# Patient Record
Sex: Male | Born: 1960 | Race: White | Hispanic: No | Marital: Married | Smoking: Never smoker
Health system: Southern US, Community
[De-identification: ages and names within clinical notes are randomized; demographics above are authoritative.]

## PROBLEM LIST (undated history)

## (undated) DIAGNOSIS — I10 Essential (primary) hypertension: Secondary | ICD-10-CM

## (undated) DIAGNOSIS — E119 Type 2 diabetes mellitus without complications: Secondary | ICD-10-CM

## (undated) DIAGNOSIS — F1010 Alcohol abuse, uncomplicated: Secondary | ICD-10-CM

## (undated) DIAGNOSIS — M1990 Unspecified osteoarthritis, unspecified site: Secondary | ICD-10-CM

## (undated) DIAGNOSIS — G4730 Sleep apnea, unspecified: Secondary | ICD-10-CM

## (undated) DIAGNOSIS — L409 Psoriasis, unspecified: Secondary | ICD-10-CM

## (undated) HISTORY — DX: Type 2 diabetes mellitus without complications: E11.9

## (undated) HISTORY — DX: Unspecified osteoarthritis, unspecified site: M19.90

## (undated) HISTORY — DX: Essential (primary) hypertension: I10

## (undated) HISTORY — PX: CHOLECYSTECTOMY: SHX55

---

## 2004-06-05 ENCOUNTER — Ambulatory Visit: Payer: Self-pay | Admitting: Internal Medicine

## 2004-06-09 ENCOUNTER — Ambulatory Visit: Payer: Self-pay | Admitting: Internal Medicine

## 2004-06-09 ENCOUNTER — Ambulatory Visit (HOSPITAL_COMMUNITY): Admission: RE | Admit: 2004-06-09 | Discharge: 2004-06-09 | Payer: Self-pay | Admitting: Internal Medicine

## 2004-06-10 ENCOUNTER — Ambulatory Visit (HOSPITAL_COMMUNITY): Admission: RE | Admit: 2004-06-10 | Discharge: 2004-06-10 | Payer: Self-pay | Admitting: Internal Medicine

## 2004-07-14 ENCOUNTER — Ambulatory Visit: Payer: Self-pay | Admitting: Internal Medicine

## 2008-06-13 ENCOUNTER — Encounter: Admission: RE | Admit: 2008-06-13 | Discharge: 2008-06-13 | Payer: Self-pay | Admitting: Occupational Medicine

## 2009-06-07 ENCOUNTER — Encounter (INDEPENDENT_AMBULATORY_CARE_PROVIDER_SITE_OTHER): Payer: Self-pay | Admitting: *Deleted

## 2009-06-28 DIAGNOSIS — K648 Other hemorrhoids: Secondary | ICD-10-CM | POA: Insufficient documentation

## 2009-06-28 DIAGNOSIS — G4730 Sleep apnea, unspecified: Secondary | ICD-10-CM | POA: Insufficient documentation

## 2009-07-01 ENCOUNTER — Ambulatory Visit: Payer: Self-pay | Admitting: Internal Medicine

## 2009-07-01 DIAGNOSIS — L408 Other psoriasis: Secondary | ICD-10-CM

## 2009-07-01 DIAGNOSIS — E119 Type 2 diabetes mellitus without complications: Secondary | ICD-10-CM

## 2009-07-04 ENCOUNTER — Encounter: Payer: Self-pay | Admitting: Internal Medicine

## 2009-07-15 ENCOUNTER — Ambulatory Visit (HOSPITAL_COMMUNITY): Admission: RE | Admit: 2009-07-15 | Discharge: 2009-07-15 | Payer: Self-pay | Admitting: Internal Medicine

## 2009-07-15 ENCOUNTER — Ambulatory Visit: Payer: Self-pay | Admitting: Internal Medicine

## 2010-03-01 HISTORY — PX: MENISCUS REPAIR: SHX5179

## 2010-07-01 NOTE — Letter (Signed)
Summary: Appointment Reminder  [facility]  [address]   [place], [place] [address]   Phone: [phone]  Fax: [phone]       June 07, 2009   Albert Tanner [address] [place], [place]  [address] [birth year]    Dear [name],  We have been unable to reach you by phone to schedule a follow up   appointment that was recommended for you by Dr. Jena Gauss. It is very   important that we reach you to schedule an appointment. We hope that you  allow [place] to participate in your health care needs. Please contact us at  [phone] at your earliest convenience to schedule your appointment.  Sincerely,    [facility] R. Roetta Sessions, M.D.    Kassie Mends, M.D. Lorenza Burton, FNP-BC    Tana Coast, PA-C Phone: [phone]    Fax: [phone]

## 2010-07-01 NOTE — Assessment & Plan Note (Signed)
Summary: E30/RECTAL BLEEDING.GU   Visit Type:  Initial Consult Referring Provider:  Sasser Primary Care Provider:  Sasser  Chief Complaint:  FH colon CA.  History of Present Illness: 50 y/o caucasian male w/ FH positive for colon CA in brother dx last yr age 12.  He denies any rectal bleeding or melena except when he has "flare of psoriasis."  Last flare over 1 month ago.  Denies any abd pain.  The patient denies any upper GI symptoms which incluse nausea, vomiting, heartburn, indigestion, dysphagia, odynophagia, anorexia or early satiety.   Current Problems (verified): 1)  Dm  (ICD-250.00) 2)  Neoplasm, Malignant, Colon, Family Hx, Sibling  (ICD-V16.0) 3)  Psoriasis  (ICD-696.1) 4)  Sleep Apnea  (ICD-780.57) 5)  Hemorrhoids, Internal  (ICD-455.0)  Current Medications (verified): 1)  Naproxen Sodium 550 Mg Tabs (Naproxen Sodium) .... As Needed 2)  Metformin .... One Tablet Daily in The Am 3)  Ointment .... Daily 4)  Aspir-Low 81 Mg Tbec (Aspirin) .... Take 1 Tablet By Mouth Once A Day  Allergies (verified): No Known Drug Allergies  Past History:  Past Medical History: DM (ICD-250.00) NEOPLASM, MALIGNANT, COLON, FAMILY HX, SIBLING (ICD-V16.0) PSORIASIS (ICD-696.1) SLEEP APNEA (ICD-780.57) HEMORRHOIDS, INTERNAL (ICD-455) last colonoscopy 06/2004 Mild diverticulitis 2/06  Family History: Brother dx colon ca age 50  Social History: married Employed city of [place] Patient has never smoked.  Illicit Drug Use - no beer or whiskey couple per monthSmoking Status:  never Drug Use:  no  Review of Systems General:  Denies fever, chills, sweats, anorexia, fatigue, weakness, malaise, weight loss, and sleep disorder. CV:  Denies chest pains, angina, palpitations, syncope, dyspnea on exertion, orthopnea, PND, peripheral edema, and claudication. Resp:  Denies dyspnea at rest, dyspnea with exercise, cough, sputum, wheezing, coughing up blood, and pleurisy. GI:  Denies difficulty  swallowing, pain on swallowing, nausea, indigestion/heartburn, vomiting, vomiting blood, abdominal pain, jaundice, gas/bloating, diarrhea, constipation, change in bowel habits, bloody BM's, black BMs, and fecal incontinence. GU:  Denies urinary burning, blood in urine, urinary frequency, urinary hesitancy, and nocturnal urination. Derm:  Complains of rash and dry skin; psoriasis. Psych:  Denies depression, anxiety, memory loss, suicidal ideation, hallucinations, paranoia, phobia, and confusion. Heme:  Denies bruising, bleeding, and enlarged lymph nodes.  Vital Signs:  Patient profile:   50 year old male Height:      72 inches Weight:      346 pounds BMI:     47.10 Temp:     98.6 degrees F oral Pulse rate:   72 / minute BP sitting:   142 / 90  (left arm) Cuff size:   large  Vitals Entered By: Cloria Spring LPN (July 01, 2009 2:25 PM)  Physical Exam  General:  obese.  Well developed, well nourished, no acute distress. Head:  Normocephalic and atraumatic. Eyes:  Sclera clear, no icterus. Ears:  Normal auditory acuity. Nose:  No deformity, discharge,  or lesions. Mouth:  No deformity or lesions, dentition normal. Neck:  Supple; no masses or thyromegaly. Lungs:  Clear throughout to auscultation. Heart:  Regular rate and rhythm; no murmurs, rubs,  or bruits. Abdomen:  Soft, nontender and nondistended. No masses, hepatosplenomegaly or hernias noted. Normal bowel sounds.without guarding and without rebound.   Rectal:  deferred until time of colonoscopy.   Msk:  Symmetrical with no gross deformities. Normal posture. Pulses:  Normal pulses noted. Extremities:  No clubbing, cyanosis, edema or deformities noted. Neurologic:  Alert and  oriented x4;  grossly normal neurologically.  Skin:  Intact without significant lesions or rashes. Cervical Nodes:  No significant cervical adenopathy. Psych:  Alert and cooperative. Normal mood and affect.  Impression & Recommendations:  Problem # 1:   NEOPLASM, MALIGNANT, COLON, FAMILY HX, SIBLING (ICD-V16.0) 68 y/o caucasian male due for high-risk screening colonoscopy.  Denies any current GI complaints.  Screeening colonoscopy to be performed by Dr. Jena Gauss in the near future.  I have discussed risks and benefits which include, but are not limited to, bleeding, infection, perforation, or medication reaction.  The patient agrees with this plan and consent will be obtained.  Orders: Consultation Level III [phone])

## 2010-07-01 NOTE — Letter (Signed)
Summary: TCS ORDER  TCS ORDER   Imported By: Ricard Dillon 07/04/2009 12:58:57  _____________________________________________________________________  External Attachment:    Type:   Image     Comment:   External Document

## 2010-08-21 LAB — GLUCOSE, CAPILLARY

## 2010-10-17 NOTE — Op Note (Signed)
NAME:  Albert Tanner, Albert Tanner             ACCOUNT NO.:  [id number]   MEDICAL RECORD NO.:  [id number]          PATIENT TYPE:  AMB   LOCATION:  DAY                           FACILITY:  [facility]   PHYSICIAN:  R. Roetta Sessions, M.D. DATE OF BIRTH:  27-Dec-1960   DATE OF PROCEDURE:  06/09/2004  DATE OF DISCHARGE:                                 OPERATIVE REPORT   PROCEDURE PERFORMED:  Colonoscopy with ileoscopy, diagnostic.   INDICATIONS FOR PROCEDURE:  The patient is a 50 year old gentleman with a  two month history of bilateral lower quadrant abdominal pain which sometimes  radiates into his back and down his right groin and testicular area.  He has  been treated for epididymitis by Dr. Neita Carp.  He has also been noted to be  Hemoccult positive at Dr. Dian Situ office although Mr. Rothgeb denies seeing  any blood per rectum.  He also denies melena.  He denies upper GI tract  symptoms such as odynophagia, dysphagia, early satiety, reflux symptoms,  nausea or vomiting.  He has not lost any weight.  Colonoscopy is now being  down to further evaluate Hemoccult positive stools.  This approach has been  discussed with the patient at length.  Potential risks, benefits and  alternatives have been reviewed, questions answered.  He is agreeable.   Oxygen saturations, blood pressure, pulses and respirations were monitored  throughout the entire procedure.   CONSCIOUS SEDATION:  Versed 4 mg IV, Demerol 100 mg IV in divided doses.   INSTRUMENT USED:  Olymppus video chip system.   FINDINGS:  Digital rectal exam revealed no abnormalities.   ENDOSCOPIC FINDINGS:  Prep was good.   Rectum:  Examination of rectal mucosa including the retroflex view of the  anal verge revealed only minimal internal hemorrhoids.   Colon:  The colonic mucosa surveyed from the rectosigmoid junction to the  left transverse and right colon to the area of the appendiceal orifice and  ileocecal valve and cecum.  These structures were  well seen and photographed  for the record.  The terminal ileum was intubated a good 20 cm as well.  The  terminal ileum mucosa appeared normal  There were prominent  lymphoid  aggregates.  From the level of the cecum and ileocecal valve the scope was  slowly withdrawn.  All previously mentioned mucosal surfaces were again  seen.  The colonic mucosa appeared entirely normal.  The patient tolerated  the procedure well and was reacted in endoscopy.   IMPRESSION:  1.  Minimal internal hemorrhoids.  Otherwise normal rectum.  Normal colon.  2.  Normal terminal ileum.   RECOMMENDATIONS:  We will proceed with an abdominopelvic CT with IV and oral  contrast to further evaluate this gentleman's lower abdominal pain.  Further  recommendations to follow.     Otelia Sergeant   RMR/MEDQ  D:  06/09/2004  T:  06/09/2004  Job:  [id number]   cc:   Fara Chute  [address] [place]  [place] [address]  Fax: [phone]

## 2010-10-17 NOTE — Consult Note (Signed)
NAME:  Albert Tanner, Albert Tanner             ACCOUNT NO.:  [id number]   MEDICAL RECORD NO.:  [id number]           PATIENT TYPE:   LOCATION:                                 FACILITY:   PHYSICIAN:  R. Roetta Sessions, M.D. DATE OF BIRTH:  August 06, 1960   DATE OF CONSULTATION:  DATE OF DISCHARGE:                                   CONSULTATION   CHIEF COMPLAINT:  Bilateral lower quadrant abdominal pain and intermittent  hematochezia.   HISTORY OF PRESENT ILLNESS:  Albert Tanner is a 50 year old Caucasian male  patient of Dr. Dian Situ.  He was recently treated with antibiotics for  epididymitis.  Upon discontinuing the antibiotics, he began to have  persistent bilateral lower quadrant abdominal pain.  He notes that the  patient is just below his umbilicus.  He is also complaining of low back  pain and pain that radiates down to his right groin and testicular area.  He  notes that the patient is typically a sharp stabbing pain.  He also notes  about two weeks ago, a low-grade fever with the pain.  He has been treated  with both Cipro and doxycycline.  He denies any diarrhea or constipation.  He typically has a bowel movement once or twice a day.  He has also noticed  intermittent hematochezia with small amounts of bright red rectal bleeding  noted on the toilet paper.  He denies any blood actually in the stool.  He  denies any known hemorrhoidal disease.  He denies any melena.  He rates the  pain around 4-5/10 on pain scale and notes that the pain is intermittent and  migratory.   PAST MEDICAL HISTORY:  1.  Sleep apnea.  2.  Epididymitis.  3.  Bone spur in his right heel for which he takes p.r.n. naproxen      infrequently.   PAST SURGICAL HISTORY:  Denies.   CURRENT MEDICATIONS:  1.  Doxycycline 100 mg b.i.d.  2.  Naproxen 550 mg p.r.n.   ALLERGIES:  NO KNOWN DRUG ALLERGIES.   FAMILY HISTORY:  No known family history of colorectal carcinoma, liver, or  chronic GI problems.  His mother at age  67 is relatively healthy and alive.  Father deceased at ago 29 secondary to diabetes mellitus and lung carcinoma.  He was a smoker.  Albert Tanner has three sisters who are relatively healthy  and three brothers with history of significant coronary artery disease and  diabetes mellitus.   SOCIAL HISTORY:  Albert Tanner has been married for 18 years.  He has two  stepchildren.  He is employed full-time with [employer].  He denies any drug or tobacco use.  He reports alcohol consumption of a beer  or two a couple of times a month.   REVIEW OF SYSTEMS:  CONSTITUTIONAL:  His weight has remained stable.  Appetite is good.  He denies any fatigue.  CARDIOVASCULAR:  Denies any chest  pain or palpations.  PULMONARY:  Denies any shortness of breath, dyspnea,  cough, or hemoptysis.  GI:  See HPI.  He denies any heartburn, indigestion,  dysphagia  or odynophagia, nausea, or vomiting.  HEMATOLOGICAL:  He denies  any history of blood dyscrasias, easy bruising, or epistaxis.   PHYSICAL EXAMINATION:  VITAL SIGNS:  Weight 330 pounds, height 71 inches.  Temperature 97.8, blood pressure 140/80, pulse 88.  GENERAL:  Albert Tanner is an obese Caucasian male who is alert, oriented,  pleasant, and cooperative, and in no acute distress.  He is accompanied by  his wife today.  HEENT:  __________ .  Sclerae nonicteric.  Conjunctivae pink.  Oropharynx  pink and moist without any lesions.  NECK:  Supple, without masses or thyromegaly.  CHEST:  Heart regular rate and rhythm with normal S1 and S2.  No clicks,  rubs, or gallops.  LUNGS:  Clear to auscultation bilaterally.  ABDOMEN:  Protuberant, obese.  He does have positive bowel sounds x4, no  bruits auscultated.  There is palpable fullness just right at the umbilicus  without discrete mass, although exam is extremely limited due to the  patient's body habitus.  Unable to palpate hepatosplenomegaly.  There is no  rebound tenderness or guarding.   RECTAL:  He does have around a 2-cm perirectal erythema with a small fissure  that is not actively bleeding.  He has good sphincter tone.  There are no  internal masses palpated.  A small amount of light brown stool is obtained  from the vault which is Hemoccult-negative.  EXTREMITIES:  2+ pedal pulses bilaterally.  No edema.  SKIN:  Pink, warm, and dry without any rash or jaundice.   IMPRESSION:  Albert Tanner is a 51 year old Caucasian male with at least a two-  month history of bilateral quadrant low abdominal pain which does radiate  through to his back and down to his right groin and testicular area.  He has  been treated with antibiotics for possible epididymitis through Dr. Dian Situ  office.  He notes resolution of his symptoms until he discontinues  antibiotics and then has flares.  He was also noted to have intermittent hematochezia and gross blood per  rectum on exam at Dr. Dian Situ office.  Given these findings, I feel that  colonoscopy should be planned to rule out colorectal carcinoma.  His  symptoms do not directly correlate with diverticulitis either.   RECOMMENDATIONS:  1.  Will schedule colonoscopy with Dr. Jena Gauss in the near future.  I have      discussed the procedure, including the risks and benefits which include      but are not limited to bleeding, infection, perforation, drug reaction,      increased pain.  Consent will be obtained.  2.  Offered cream for perirectal irritation; however, the patient has      declined at this time.  3.  Further recommendations pending procedure.   We would like to thank Dr. Neita Carp for allowing [place] to participate in the care  of Albert Tanner.     Kand   KC/MEDQ  D:  06/05/2004  T:  06/05/2004  Job:  [id number]

## 2012-07-14 ENCOUNTER — Ambulatory Visit (INDEPENDENT_AMBULATORY_CARE_PROVIDER_SITE_OTHER): Payer: 59 | Admitting: General Surgery

## 2012-07-29 ENCOUNTER — Encounter (INDEPENDENT_AMBULATORY_CARE_PROVIDER_SITE_OTHER): Payer: Self-pay | Admitting: General Surgery

## 2012-07-29 ENCOUNTER — Ambulatory Visit (INDEPENDENT_AMBULATORY_CARE_PROVIDER_SITE_OTHER): Payer: 59 | Admitting: General Surgery

## 2012-07-29 DIAGNOSIS — Z6841 Body Mass Index (BMI) 40.0 and over, adult: Secondary | ICD-10-CM

## 2012-07-29 DIAGNOSIS — I10 Essential (primary) hypertension: Secondary | ICD-10-CM

## 2012-07-29 DIAGNOSIS — E119 Type 2 diabetes mellitus without complications: Secondary | ICD-10-CM

## 2012-07-29 NOTE — Progress Notes (Signed)
Subjective:   morbid obesity  Patient ID: Albert Tanner, male   DOB: [birth year], 52 y.o.   MRN: [id number]  HPI Albert Tanner y.o.male presents for consideration for surgical treatment for morbid obesity.  he gives a history of progressive obesity sincechildhood  despite multiple attempts at medical management.  his weight has been affecting him in a number of ways includingchronic joint pain that limits his activity as well as the onset of diabetes and mild hypertension and sleep apnea. He is very concerned about his long-term health going forward at his current weight.. he has been to our initial information seminar, researched surgical options thoroughly and to this point is undecided about which operation might be best for him.  Past Medical History  Diagnosis Date  . Arthritis   . Diabetes mellitus without complication   . Hypertension    Past Surgical History  Procedure Laterality Date  . Meniscus repair  03/2010   Current Outpatient Prescriptions  Medication Sig Dispense Refill  . aspirin 81 MG tablet Take 81 mg by mouth daily.      Marland Kitchen lisinopril (PRINIVIL,ZESTRIL) 10 MG tablet Take 10 mg by mouth daily.      . metFORMIN (GLUCOPHAGE) 500 MG tablet Take 500 mg by mouth daily.      . naproxen (NAPROSYN) 500 MG tablet Take 500 mg by mouth daily.      Mordecai Maes ointment        No current facility-administered medications for this visit.   No Known Allergies History  Substance Use Topics  . Smoking status: Never Smoker   . Smokeless tobacco: Never Used  . Alcohol Use: Yes     Comment: Social.     Review of Systems General: Denies fever chills malaise HEENT: Eyes vision hearing swallowing problems Respiratory: Some shortness of breath with exertion, flight of stairs Cardiac: Denies chest pain palpitations he does have some leg edema Abdomen: No reflux abdominal pain change in bowel habits. Colonoscopy is up-to-date. Extremities: Mild lower extremity edema and venous  changes     Objective:   Physical Exam BP 142/88  Pulse 88  Temp(Src) 97.8 F (36.6 C) (Temporal)  Resp 20  Ht 5\' 11"  (1.803 m)  Wt 342 lb (155.13 kg)  BMI 47.72 kg/m2 General: Alert, morbidly obese Caucasian male, in no distress Skin: Warm and dry without rash. There are a few superficial skin wound scattered over the abdomen. HEENT: No palpable masses or thyromegaly. Sclera nonicteric. Pupils equal round and reactive. Oropharynx clear. Lymph nodes: No cervical, supraclavicular, or inguinal nodes palpable. Lungs: Breath sounds clear and equal without increased work of breathing Cardiovascular: Regular rate and rhythm without murmur. No JVD. There are moderate changes of chronic venous stasis bilateral lower extremities.. Peripheral pulses intact. Abdomen: Nondistended. Soft and nontender. No masses palpable. No organomegaly. No palpable hernias. Extremities: No edema or joint swelling or deformity. No chronic venous stasis changes. Neurologic: Alert and fully oriented. Gait normal.    Assessment:     Patient with progressive morbid obesity unresponsive to multiple efforts at medical management who presents with a BMI of 47 and comorbidities of diabetes mellitus, chronic joint pain,hypercholesterolemia and hypertension. I believe there would be very significant medical benefit from surgical weight loss. After our discussion of surgical options currently available the patient has decided to proceed with laparoscopic Roux-en-Y gastric bypass due to it's specific effect on diabetes and reliability of the weight loss. We have discussed the nature of the problem and the  risks of remaining morbidly obese. We discussed laparoscopic Roux-en-Y gastric bypass in detail including the nature of the procedure, expected hospitalization and recovery, and major risks of anesthetic complications, bleeding, blood clots and pulmonary emboli, leakage and infection and long-term risks of stricture, ulceration,  bowel obstruction, nutritional deficiencies, and failure to lose weight or weight regain. We discussed these problems could lead to death. The patient was given a complete consent form to review and all questions were answered. We will go ahead with preoperative including psychological and nutrition evaluations, H. pylori testing, ultrasound, bone density, and routine lab and x-rays. I will see the patient back following these studies.    Plan:     As above

## 2012-08-06 ENCOUNTER — Encounter: Payer: Self-pay | Admitting: *Deleted

## 2012-08-06 ENCOUNTER — Encounter: Payer: 59 | Attending: General Surgery | Admitting: *Deleted

## 2012-08-06 DIAGNOSIS — Z713 Dietary counseling and surveillance: Secondary | ICD-10-CM | POA: Insufficient documentation

## 2012-08-06 NOTE — Progress Notes (Addendum)
  Pre-Op Assessment Visit:  Pre-Operative RYGB Surgery  Medical Nutrition Therapy:  Appt start time: 1600   End time:  1700.  Patient was seen on 08/06/2012 for Pre-Operative RYGB Nutrition Assessment. Assessment and letter of approval faxed to [facility] Bariatric Surgery Program coordinator on 08/08/12.  Approval letter sent to [facility] center and will be available in the chart under the media tab.  Handouts given during visit include:  Pre-Op Goals   Bariatric Surgery Protein Shakes  Samples given during visit include:   Premier Protein shake Lot: [id number]; Exp: 06/13/13 Lot: [id number]; Exp: 04/08/13  Patient to call for Pre-Op and Post-Op Nutrition Education at the [facility] when surgery is scheduled.

## 2012-08-06 NOTE — Patient Instructions (Addendum)
   Follow Pre-Op Nutrition Goals to prepare for Gastric Bypass Surgery.   Call the [facility] at [phone] once you have been given your surgery date to enrolled in the Pre-Op Nutrition Class. You will need to attend this nutrition class 3-4 weeks prior to your surgery. 

## 2012-08-10 LAB — CBC WITH DIFFERENTIAL/PLATELET
Basophils Absolute: 0 10*3/uL (ref 0.0–0.1)
Basophils Relative: 0 % (ref 0–1)
Eosinophils Absolute: 0.2 10*3/uL (ref 0.0–0.7)
Eosinophils Relative: 2 % (ref 0–5)
HCT: 43 % (ref 39.0–52.0)
MCH: 29.8 pg (ref 26.0–34.0)
MCHC: 34.2 g/dL (ref 30.0–36.0)
MCV: 87 fL (ref 78.0–100.0)
Monocytes Absolute: 0.8 10*3/uL (ref 0.1–1.0)
RDW: 14 % (ref 11.5–15.5)

## 2012-08-10 LAB — LIPID PANEL
HDL: 40 mg/dL (ref >39)
Triglycerides: 75 mg/dL (ref <150)

## 2012-08-10 LAB — COMPREHENSIVE METABOLIC PANEL
AST: 19 U/L (ref 0–37)
Alkaline Phosphatase: 64 U/L (ref 39–117)
BUN: 15 mg/dL (ref 6–23)
Creat: 1.01 mg/dL (ref 0.50–1.35)

## 2012-08-11 LAB — HEMOGLOBIN A1C
Hgb A1c MFr Bld: 6.6 % — ABNORMAL HIGH (ref <5.7)
Mean Plasma Glucose: 143 mg/dL — ABNORMAL HIGH (ref <117)

## 2012-08-19 ENCOUNTER — Ambulatory Visit (HOSPITAL_COMMUNITY)
Admission: RE | Admit: 2012-08-19 | Discharge: 2012-08-19 | Disposition: A | Discharge status: Home / Self Care | Payer: 59 | Source: Ambulatory Visit | Attending: General Surgery | Admitting: General Surgery

## 2012-08-22 ENCOUNTER — Ambulatory Visit (HOSPITAL_COMMUNITY)
Admission: RE | Admit: 2012-08-22 | Discharge: 2012-08-22 | Disposition: A | Discharge status: Home / Self Care | Payer: 59 | Source: Ambulatory Visit | Attending: General Surgery | Admitting: General Surgery

## 2012-08-22 ENCOUNTER — Other Ambulatory Visit: Payer: Self-pay

## 2012-08-22 DIAGNOSIS — G4730 Sleep apnea, unspecified: Secondary | ICD-10-CM | POA: Insufficient documentation

## 2012-08-22 DIAGNOSIS — K7689 Other specified diseases of liver: Secondary | ICD-10-CM | POA: Insufficient documentation

## 2012-08-22 DIAGNOSIS — Z6841 Body Mass Index (BMI) 40.0 and over, adult: Secondary | ICD-10-CM | POA: Insufficient documentation

## 2012-08-22 DIAGNOSIS — E119 Type 2 diabetes mellitus without complications: Secondary | ICD-10-CM | POA: Insufficient documentation

## 2012-08-22 DIAGNOSIS — E7800 Pure hypercholesterolemia, unspecified: Secondary | ICD-10-CM | POA: Insufficient documentation

## 2012-08-22 DIAGNOSIS — I10 Essential (primary) hypertension: Secondary | ICD-10-CM | POA: Insufficient documentation

## 2012-08-22 DIAGNOSIS — M129 Arthropathy, unspecified: Secondary | ICD-10-CM | POA: Insufficient documentation

## 2012-09-09 ENCOUNTER — Encounter (HOSPITAL_COMMUNITY)
Admission: RE | Disposition: A | Discharge status: Home / Self Care | Payer: Self-pay | Source: Ambulatory Visit | Attending: General Surgery

## 2012-09-09 ENCOUNTER — Ambulatory Visit (HOSPITAL_COMMUNITY)
Admission: RE | Admit: 2012-09-09 | Discharge: 2012-09-09 | Disposition: A | Discharge status: Home / Self Care | Payer: 59 | Source: Ambulatory Visit | Attending: General Surgery | Admitting: General Surgery

## 2012-09-09 DIAGNOSIS — Z01818 Encounter for other preprocedural examination: Secondary | ICD-10-CM | POA: Insufficient documentation

## 2012-09-09 HISTORY — PX: BREATH TEK H PYLORI: SHX5422

## 2012-09-09 SURGERY — BREATH TEST, FOR HELICOBACTER PYLORI

## 2012-09-12 ENCOUNTER — Encounter (HOSPITAL_COMMUNITY): Payer: Self-pay | Admitting: General Surgery

## 2012-09-29 ENCOUNTER — Encounter: Payer: 59 | Attending: General Surgery | Admitting: *Deleted

## 2012-09-29 DIAGNOSIS — Z713 Dietary counseling and surveillance: Secondary | ICD-10-CM | POA: Insufficient documentation

## 2012-10-05 ENCOUNTER — Encounter (INDEPENDENT_AMBULATORY_CARE_PROVIDER_SITE_OTHER): Payer: Self-pay | Admitting: General Surgery

## 2012-10-05 ENCOUNTER — Ambulatory Visit (INDEPENDENT_AMBULATORY_CARE_PROVIDER_SITE_OTHER): Payer: 59 | Admitting: General Surgery

## 2012-10-05 ENCOUNTER — Other Ambulatory Visit (INDEPENDENT_AMBULATORY_CARE_PROVIDER_SITE_OTHER): Payer: Self-pay | Admitting: General Surgery

## 2012-10-05 NOTE — Progress Notes (Signed)
Chief complaint: Morbid obesity, preop sleeve gastrectomy  History: Patient returns for his preop visit prior to planned laparoscopic sleeve gastrectomy. At his initial visit he had elected to proceed with Roux-en-Y gastric bypass but was not completely certain about his decision. Since then he has done extensive research on surgical options in addition to what we discussed in the office. He expressed very clear understanding of the differences between the procedures and he feels at this point he would be most comfortable with the sleeve gastrectomy as it is somewhat less extensive than the Roux-en-Y gastric bypass but does not require repeated office visits and fills such as the band. We went over the procedure again in detail. He has been through the complete consent form were reviewed this and all his questions were answered.  Past Medical History  Diagnosis Date  . Arthritis   . Diabetes mellitus without complication   . Hypertension    Past Surgical History  Procedure Laterality Date  . Meniscus repair  03/2010  . Breath tek h pylori N/A 09/09/2012    Procedure: BREATH TEK H PYLORI;  Surgeon: Mariella Saa, MD;  Location: [facility];  Service: General;  Laterality: N/A;   Current Outpatient Prescriptions  Medication Sig Dispense Refill  . aspirin 81 MG tablet Take 81 mg by mouth daily.      Marland Kitchen lisinopril (PRINIVIL,ZESTRIL) 10 MG tablet Take 10 mg by mouth daily.      . metFORMIN (GLUCOPHAGE) 500 MG tablet Take 500 mg by mouth daily.      . naproxen (NAPROSYN) 500 MG tablet Take 500 mg by mouth daily.      Mordecai Maes ointment Apply 1 application topically daily.        No current facility-administered medications for this visit.   No Known Allergies History  Substance Use Topics  . Smoking status: Never Smoker   . Smokeless tobacco: Never Used  . Alcohol Use: Yes     Comment: Social.   Exam: BP 120/82  Pulse 92  Temp(Src) 98.1 F (36.7 C) (Oral)  Resp 14  Ht 5\' 11"   (1.803 m)  Wt 343 lb 9.6 oz (155.856 kg)  BMI 47.94 kg/m2 General: Obese but otherwise well-appearing Caucasian male Skin: Patchy scaly rash over his knees and elbows and a few small patches on his abdomen consistent with psoriasis HEENT: No palpable masses or thyromegaly. Sclera nonicteric. Lungs: Clear equal breath sounds without increased work of breathing or wheezing Cardiac: Regular rate and rhythm. No murmurs. No edema. Abdomen: Obese. Soft and nontender. No discernible masses or organomegaly. Extremities: No edema or joint swelling or cyanosis Neurologic: Alert and fully oriented. Affect normal. Gait normal.  Review preoperative studies today. Ultrasound of the head was unremarkable. He successfully completed nutrition and psychological evaluation. Lab work significant for a hemoglobin A1c of 6.6 and mildly elevated cholesterol.  Assessment and plan: A 52 year old male with progressive morbid obesity unresponsive to medical management and multiple comorbidities including diabetes mellitus, sleep apnea, dyslipidemia, hypertension and chronic joint pain. We plan to proceed with sleeve gastrectomy. The only other workup he needs with his change in plan is upper GI series and we will get this done shortly. His surgery is scheduled for later this month. Again all his questions were answered and the consent form was reviewed.

## 2012-10-06 ENCOUNTER — Ambulatory Visit (HOSPITAL_COMMUNITY)
Admission: RE | Admit: 2012-10-06 | Discharge: 2012-10-06 | Disposition: A | Discharge status: Home / Self Care | Payer: 59 | Source: Ambulatory Visit | Attending: General Surgery | Admitting: General Surgery

## 2012-10-06 DIAGNOSIS — Z01818 Encounter for other preprocedural examination: Secondary | ICD-10-CM | POA: Insufficient documentation

## 2012-10-06 DIAGNOSIS — K449 Diaphragmatic hernia without obstruction or gangrene: Secondary | ICD-10-CM | POA: Insufficient documentation

## 2012-10-07 ENCOUNTER — Encounter (HOSPITAL_COMMUNITY): Payer: Self-pay | Admitting: Pharmacy Technician

## 2012-10-16 NOTE — Patient Instructions (Signed)
Follow:   Pre-Op Diet per MD 2 weeks prior to surgery  Phase 2- Liquids (clear/full) 2 weeks after surgery  Vitamin/Mineral/Calcium guidelines for purchasing bariatric supplements  Exercise guidelines pre and post-op per MD  Follow-up at [facility] in 2 weeks post-op for diet advancement. Contact Baker Kogler as needed with questions/concerns. 

## 2012-10-16 NOTE — Progress Notes (Signed)
Bariatric Class:  Appt start time: 1730 end time:  1830.  Pre-Operative Nutrition Class  Patient was seen on 09/29/12 for Pre-Operative Bariatric Surgery Education at [facility].   Surgery date: 10/25/12 Surgery type: Sleeve Surgeon: Biagio Quint Start weight at [facility]: 342.7 lbs (08/06/12)  Weight today: 337.0 lbs Weight change: 5.7 lbs Total weight lost: 5.7 lbs  TANITA  BODY COMP RESULTS  09/29/12   BMI (kg/m^2) 47.0   Fat Mass (lbs) 152.5   Fat Free Mass (lbs) 184.5   Total Body Water (lbs) 135.9   Samples given per MNT protocol: Bariatric Advantage Multivitamin Lot # [id number] Exp: 10/15  Bariatric Advantage Calcium Citrate Lot # [id number] Exp: 10/15  Celebrate Vitamins Multivitamin Lot # [id number] Exp: 07/15  Premier Protein Shake Lot # [id number] Exp: 06/13/13  Corliss Marcus Protein Powder Lot # [id number] Exp: 06/15  The following the learning objective met by the patient during this course:  Identifies Pre-Op Dietary Goals and will begin 2 weeks pre-operatively  Identifies appropriate sources of fluids and proteins   States protein recommendations and appropriate sources pre and post-operatively  Identifies Post-Operative Dietary Goals and will follow for 2 weeks post-operatively  Identifies appropriate multivitamin and calcium sources  Describes the need for physical activity post-operatively and will follow MD recommendations  States when to call healthcare provider regarding medication questions or post-operative complications  Handouts given during class include:  Pre-Op Bariatric Surgery Diet Handout  Protein Shake Handout  Post-Op Bariatric Surgery Nutrition Handout  BELT Program Information Flyer  Support Group Information Flyer  [facility] Bariatric Supplements Price List  Follow-Up Plan: Patient will follow-up at [facility] 2 weeks post operatively for diet advancement per MD.

## 2012-10-21 ENCOUNTER — Encounter (HOSPITAL_COMMUNITY): Payer: Self-pay

## 2012-10-21 ENCOUNTER — Ambulatory Visit (HOSPITAL_COMMUNITY)
Admission: RE | Admit: 2012-10-21 | Discharge: 2012-10-21 | Disposition: A | Discharge status: Home / Self Care | Payer: 59 | Source: Ambulatory Visit | Attending: General Surgery | Admitting: General Surgery

## 2012-10-21 ENCOUNTER — Encounter (HOSPITAL_COMMUNITY)
Admission: RE | Admit: 2012-10-21 | Discharge: 2012-10-21 | Disposition: A | Discharge status: Home / Self Care | Payer: 59 | Source: Ambulatory Visit | Attending: General Surgery | Admitting: General Surgery

## 2012-10-21 DIAGNOSIS — E119 Type 2 diabetes mellitus without complications: Secondary | ICD-10-CM | POA: Insufficient documentation

## 2012-10-21 DIAGNOSIS — Z01818 Encounter for other preprocedural examination: Secondary | ICD-10-CM | POA: Insufficient documentation

## 2012-10-21 DIAGNOSIS — Z01812 Encounter for preprocedural laboratory examination: Secondary | ICD-10-CM | POA: Insufficient documentation

## 2012-10-21 DIAGNOSIS — L409 Psoriasis, unspecified: Secondary | ICD-10-CM

## 2012-10-21 DIAGNOSIS — M5380 Other specified dorsopathies, site unspecified: Secondary | ICD-10-CM | POA: Insufficient documentation

## 2012-10-21 HISTORY — DX: Sleep apnea, unspecified: G47.30

## 2012-10-21 HISTORY — DX: Psoriasis, unspecified: L40.9

## 2012-10-21 LAB — COMPREHENSIVE METABOLIC PANEL
ALT: 24 U/L (ref 0–53)
Alkaline Phosphatase: 86 U/L (ref 39–117)
BUN: 15 mg/dL (ref 6–23)
CO2: 29 mEq/L (ref 19–32)
Chloride: 104 mEq/L (ref 96–112)
GFR calc Af Amer: >90 mL/min (ref >90)
GFR calc non Af Amer: >90 mL/min (ref >90)
Glucose, Bld: 113 mg/dL — ABNORMAL HIGH (ref 70–99)
Potassium: 4.5 mEq/L (ref 3.5–5.1)
Total Bilirubin: 0.4 mg/dL (ref 0.3–1.2)
Total Protein: 7.5 g/dL (ref 6.0–8.3)

## 2012-10-21 LAB — CBC WITH DIFFERENTIAL/PLATELET
Eosinophils Absolute: 0.2 10*3/uL (ref 0.0–0.7)
Hemoglobin: 14.8 g/dL (ref 13.0–17.0)
Lymphocytes Relative: 24 % (ref 12–46)
Lymphs Abs: 2 10*3/uL (ref 0.7–4.0)
MCH: 30 pg (ref 26.0–34.0)
MCV: 88.8 fL (ref 78.0–100.0)
Monocytes Relative: 10 % (ref 3–12)
Neutrophils Relative %: 64 % (ref 43–77)
RBC: 4.93 MIL/uL (ref 4.22–5.81)
WBC: 8.3 10*3/uL (ref 4.0–10.5)

## 2012-10-21 LAB — SURGICAL PCR SCREEN: MRSA, PCR: NEGATIVE

## 2012-10-21 NOTE — Pre-Procedure Instructions (Signed)
10-21-12 EKG 3'14 -Epic/ CXR done today.

## 2012-10-21 NOTE — Patient Instructions (Addendum)
20 [name]  10/21/2012   Your procedure is scheduled on:  5-27 -2014  Report to [facility] at    0515    AM.  Call this number if you have problems the morning of surgery: [phone]  Or Presurgical Testing [phone]([name])   Remember: Follow any bowel prep instructions per MD office. For Cpap use: Bring mask and tubing only.   Do not eat food:After Midnight.    Take these medicines the morning of surgery with A SIP OF WATER: none . Do not take any Diabetic meds Am of surgery. Do not take Lisinopril Am of surgery. Stop Aspirin and Naproxen today.   Do not wear jewelry, make-up or nail polish.  Do not wear lotions, powders, or perfumes. You may wear deodorant.  Do not shave 12 hours prior to first CHG shower(legs and under arms).(face and neck okay.)  Do not bring valuables to the hospital.  Contacts, dentures or bridgework,body piercing,  may not be worn into surgery.  Leave suitcase in the car. After surgery it may be brought to your room.  For patients admitted to the hospital, checkout time is 11:00 AM the day of discharge.   Patients discharged the day of surgery will not be allowed to drive home. Must have responsible person with you x 24 hours once discharged.  Name and phone number of your driver: Albert Tanner -spouse [phone] cell  Special Instructions: CHG(Chlorhedine 4%-"Hibiclens","Betasept","Aplicare") Shower Use Special Wash: see special instructions.(avoid face and genitals)   Please read over the following fact sheets that you were given: MRSA Information, Blood Transfusion fact sheet, Incentive Spirometry Instruction.    Failure to follow these instructions may result in Cancellation of your surgery.   Patient signature_______________________________________________________

## 2012-10-25 ENCOUNTER — Encounter (HOSPITAL_COMMUNITY): Payer: Self-pay | Admitting: Anesthesiology

## 2012-10-25 ENCOUNTER — Encounter (HOSPITAL_COMMUNITY): Payer: Self-pay | Admitting: *Deleted

## 2012-10-25 ENCOUNTER — Inpatient Hospital Stay (HOSPITAL_COMMUNITY)
Admission: RE | Admit: 2012-10-25 | Discharge: 2012-10-27 | DRG: 621 | Disposition: A | Discharge status: Home / Self Care | Payer: 59 | Source: Ambulatory Visit | Attending: General Surgery | Admitting: General Surgery

## 2012-10-25 ENCOUNTER — Encounter (HOSPITAL_COMMUNITY)
Admission: RE | Disposition: A | Discharge status: Home / Self Care | Payer: Self-pay | Source: Ambulatory Visit | Attending: General Surgery

## 2012-10-25 ENCOUNTER — Inpatient Hospital Stay (HOSPITAL_COMMUNITY): Payer: 59 | Admitting: Anesthesiology

## 2012-10-25 DIAGNOSIS — G4730 Sleep apnea, unspecified: Secondary | ICD-10-CM | POA: Diagnosis present

## 2012-10-25 DIAGNOSIS — E119 Type 2 diabetes mellitus without complications: Secondary | ICD-10-CM | POA: Diagnosis present

## 2012-10-25 DIAGNOSIS — G4733 Obstructive sleep apnea (adult) (pediatric): Secondary | ICD-10-CM

## 2012-10-25 DIAGNOSIS — E785 Hyperlipidemia, unspecified: Secondary | ICD-10-CM | POA: Diagnosis present

## 2012-10-25 DIAGNOSIS — Z6841 Body Mass Index (BMI) 40.0 and over, adult: Secondary | ICD-10-CM

## 2012-10-25 DIAGNOSIS — I10 Essential (primary) hypertension: Secondary | ICD-10-CM | POA: Diagnosis present

## 2012-10-25 DIAGNOSIS — L408 Other psoriasis: Secondary | ICD-10-CM | POA: Diagnosis present

## 2012-10-25 HISTORY — PX: LAPAROSCOPIC GASTRIC SLEEVE RESECTION: SHX5895

## 2012-10-25 HISTORY — PX: ESOPHAGOGASTRODUODENOSCOPY: SHX5428

## 2012-10-25 LAB — GLUCOSE, CAPILLARY
Glucose-Capillary: 138 mg/dL — ABNORMAL HIGH (ref 70–99)
Glucose-Capillary: 127 mg/dL — ABNORMAL HIGH (ref 70–99)
Glucose-Capillary: 128 mg/dL — ABNORMAL HIGH (ref 70–99)

## 2012-10-25 SURGERY — GASTRECTOMY, SLEEVE, LAPAROSCOPIC
Anesthesia: General | Site: Esophagus | Wound class: Clean Contaminated

## 2012-10-25 MED ORDER — UNJURY CHICKEN SOUP POWDER
2.0000 [oz_av] | Freq: Four times a day (QID) | ORAL | Status: DC
Start: 2012-10-27 — End: 2012-10-27
  Filled 2012-10-25 (×4): qty 27

## 2012-10-25 MED ORDER — UNJURY CHOCOLATE CLASSIC POWDER
2.0000 [oz_av] | Freq: Four times a day (QID) | ORAL | Status: DC
  Administered 2012-10-27: 2 [oz_av] via ORAL
  Filled 2012-10-25 (×4): qty 27

## 2012-10-25 MED ORDER — LIDOCAINE HCL (CARDIAC) 20 MG/ML IV SOLN
INTRAVENOUS | Status: DC | PRN
  Administered 2012-10-25: 20 mg via INTRAVENOUS
  Administered 2012-10-25: 50 mg via INTRAVENOUS
  Administered 2012-10-25: 20 mg via INTRAVENOUS

## 2012-10-25 MED ORDER — OXYCODONE-ACETAMINOPHEN 5-325 MG/5ML PO SOLN
5.0000 mL | ORAL | Status: DC | PRN
  Administered 2012-10-26 – 2012-10-27 (×5): 10 mL via ORAL
  Filled 2012-10-25 (×5): qty 10

## 2012-10-25 MED ORDER — LACTATED RINGERS IV SOLN
INTRAVENOUS | Status: DC | PRN
  Administered 2012-10-25 (×2): via INTRAVENOUS
  Administered 2012-10-25: 11:00:00

## 2012-10-25 MED ORDER — MIDAZOLAM HCL 5 MG/5ML IJ SOLN
INTRAMUSCULAR | Status: DC | PRN
  Administered 2012-10-25: 1 mg via INTRAVENOUS
  Administered 2012-10-25: 2 mg via INTRAVENOUS

## 2012-10-25 MED ORDER — SUCCINYLCHOLINE CHLORIDE 20 MG/ML IJ SOLN
INTRAMUSCULAR | Status: DC | PRN
  Administered 2012-10-25: 200 mg via INTRAVENOUS

## 2012-10-25 MED ORDER — POTASSIUM CHLORIDE IN NACL 20-0.9 MEQ/L-% IV SOLN
INTRAVENOUS | Status: DC
  Administered 2012-10-25 – 2012-10-27 (×5): via INTRAVENOUS
  Filled 2012-10-25 (×7): qty 1000

## 2012-10-25 MED ORDER — ONDANSETRON HCL 4 MG/2ML IJ SOLN: 4.0000 mg | INTRAMUSCULAR | Status: DC | PRN

## 2012-10-25 MED ORDER — BUPIVACAINE-EPINEPHRINE 0.25% -1:200000 IJ SOLN
INTRAMUSCULAR | Status: DC | PRN
  Administered 2012-10-25: 48 mL

## 2012-10-25 MED ORDER — HEPARIN SODIUM (PORCINE) 5000 UNIT/ML IJ SOLN
5000.0000 [IU] | Freq: Three times a day (TID) | INTRAMUSCULAR | Status: DC
  Administered 2012-10-25 – 2012-10-27 (×5): 5000 [IU] via SUBCUTANEOUS
  Filled 2012-10-25 (×8): qty 1

## 2012-10-25 MED ORDER — LACTATED RINGERS IR SOLN
Status: DC | PRN
  Administered 2012-10-25: 3000 mL

## 2012-10-25 MED ORDER — ACETAMINOPHEN 160 MG/5ML PO SOLN: 650.0000 mg | ORAL | Status: DC | PRN

## 2012-10-25 MED ORDER — HYDROMORPHONE HCL PF 1 MG/ML IJ SOLN
0.2500 mg | INTRAMUSCULAR | Status: DC | PRN
  Administered 2012-10-25 (×2): 0.5 mg via INTRAVENOUS

## 2012-10-25 MED ORDER — ONDANSETRON HCL 4 MG/2ML IJ SOLN
INTRAMUSCULAR | Status: DC | PRN
  Administered 2012-10-25: 4 mg via INTRAVENOUS

## 2012-10-25 MED ORDER — TISSEEL VH 10 ML EX KIT
PACK | CUTANEOUS | Status: DC | PRN
  Administered 2012-10-25: 1

## 2012-10-25 MED ORDER — PROMETHAZINE HCL 25 MG/ML IJ SOLN
6.2500 mg | INTRAMUSCULAR | Status: DC | PRN
  Administered 2012-10-25: 6.25 mg via INTRAVENOUS

## 2012-10-25 MED ORDER — SUFENTANIL CITRATE 50 MCG/ML IV SOLN
INTRAVENOUS | Status: DC | PRN
  Administered 2012-10-25: 15 ug via INTRAVENOUS
  Administered 2012-10-25: 5 ug via INTRAVENOUS
  Administered 2012-10-25: 15 ug via INTRAVENOUS
  Administered 2012-10-25: 10 ug via INTRAVENOUS
  Administered 2012-10-25: 15 ug via INTRAVENOUS
  Administered 2012-10-25 (×4): 10 ug via INTRAVENOUS
  Administered 2012-10-25: 5 ug via INTRAVENOUS
  Administered 2012-10-25 (×2): 10 ug via INTRAVENOUS

## 2012-10-25 MED ORDER — DEXTROSE 5 % IV SOLN
2.0000 g | INTRAVENOUS | Status: AC
  Administered 2012-10-25: 2 g via INTRAVENOUS

## 2012-10-25 MED ORDER — ACETAMINOPHEN 10 MG/ML IV SOLN
INTRAVENOUS | Status: DC | PRN
  Administered 2012-10-25: 1000 mg via INTRAVENOUS

## 2012-10-25 MED ORDER — KETOROLAC TROMETHAMINE 30 MG/ML IJ SOLN: 15.0000 mg | Freq: Once | INTRAMUSCULAR | Status: DC | PRN

## 2012-10-25 MED ORDER — HEPARIN SODIUM (PORCINE) 5000 UNIT/ML IJ SOLN
5000.0000 [IU] | INTRAMUSCULAR | Status: AC
  Administered 2012-10-25: 5000 [IU] via SUBCUTANEOUS
  Filled 2012-10-25: qty 1

## 2012-10-25 MED ORDER — MORPHINE SULFATE 2 MG/ML IJ SOLN
2.0000 mg | INTRAMUSCULAR | Status: DC | PRN
  Administered 2012-10-25: 4 mg via INTRAVENOUS
  Administered 2012-10-25: 2 mg via INTRAVENOUS
  Administered 2012-10-25 (×2): 4 mg via INTRAVENOUS
  Administered 2012-10-26 (×3): 6 mg via INTRAVENOUS
  Filled 2012-10-25: qty 2
  Filled 2012-10-25: qty 3
  Filled 2012-10-25 (×2): qty 2
  Filled 2012-10-25: qty 1
  Filled 2012-10-25 (×2): qty 3

## 2012-10-25 MED ORDER — LABETALOL HCL 5 MG/ML IV SOLN
INTRAVENOUS | Status: DC | PRN
  Administered 2012-10-25: 5 mg via INTRAVENOUS

## 2012-10-25 MED ORDER — UNJURY VANILLA POWDER
2.0000 [oz_av] | Freq: Four times a day (QID) | ORAL | Status: DC
  Filled 2012-10-25 (×4): qty 27

## 2012-10-25 MED ORDER — DEXTROSE 5 % IV SOLN
2.0000 g | INTRAVENOUS | Status: DC | PRN
  Administered 2012-10-25: 2 g via INTRAVENOUS

## 2012-10-25 MED ORDER — NEOSTIGMINE METHYLSULFATE 1 MG/ML IJ SOLN
INTRAMUSCULAR | Status: DC | PRN
  Administered 2012-10-25: 4 mg via INTRAVENOUS

## 2012-10-25 MED ORDER — GLYCOPYRROLATE 0.2 MG/ML IJ SOLN
INTRAMUSCULAR | Status: DC | PRN
  Administered 2012-10-25: .6 mg via INTRAVENOUS

## 2012-10-25 MED ORDER — PROPOFOL 10 MG/ML IV BOLUS
INTRAVENOUS | Status: DC | PRN
  Administered 2012-10-25: 300 mg via INTRAVENOUS

## 2012-10-25 MED ORDER — ROCURONIUM BROMIDE 100 MG/10ML IV SOLN
INTRAVENOUS | Status: DC | PRN
  Administered 2012-10-25: 50 mg via INTRAVENOUS

## 2012-10-25 MED ORDER — INSULIN ASPART 100 UNIT/ML ~~LOC~~ SOLN
0.0000 [IU] | SUBCUTANEOUS | Status: DC
  Administered 2012-10-25 (×3): 2 [IU] via SUBCUTANEOUS
  Administered 2012-10-26: 08:00:00 via SUBCUTANEOUS

## 2012-10-25 SURGICAL SUPPLY — 71 items
ADH SKN CLS APL DERMABOND .7 (GAUZE/BANDAGES/DRESSINGS) ×2
APL SRG 32X5 SNPLK LF DISP (MISCELLANEOUS) ×2
APPLICATOR COTTON TIP 6IN STRL (MISCELLANEOUS) IMPLANT
APPLIER CLIP 5 13 M/L LIGAMAX5 (MISCELLANEOUS) ×3
APPLIER CLIP ROT 10 11.4 M/L (STAPLE)
APR CLP MED LRG 11.4X10 (STAPLE)
APR CLP MED LRG 5 ANG JAW (MISCELLANEOUS) ×2
BAG SPEC RTRVL LRG 6X4 10 (ENDOMECHANICALS)
CABLE HIGH FREQUENCY MONO STRZ (ELECTRODE) ×1 IMPLANT
CANISTER SUCTION 2500CC (MISCELLANEOUS) ×3 IMPLANT
CHLORAPREP W/TINT 26ML (MISCELLANEOUS) ×6 IMPLANT
CLIP APPLIE 5 13 M/L LIGAMAX5 (MISCELLANEOUS) IMPLANT
CLIP APPLIE ROT 10 11.4 M/L (STAPLE) IMPLANT
CLIP SUT LAPRA TY ABSORB (SUTURE) ×1 IMPLANT
CLOTH BEACON ORANGE TIMEOUT ST (SAFETY) ×3 IMPLANT
DERMABOND ADVANCED (GAUZE/BANDAGES/DRESSINGS) ×1
DERMABOND ADVANCED .7 DNX12 (GAUZE/BANDAGES/DRESSINGS) IMPLANT
DEVICE SUTURE ENDOST 10MM (ENDOMECHANICALS) ×1 IMPLANT
DRAIN CHANNEL 19F RND (DRAIN) ×3 IMPLANT
DRAPE LAPAROSCOPIC ABDOMINAL (DRAPES) ×2 IMPLANT
DRAPE UTILITY XL STRL (DRAPES) ×6 IMPLANT
ELECT REM PT RETURN 9FT ADLT (ELECTROSURGICAL) ×3
ELECTRODE REM PT RTRN 9FT ADLT (ELECTROSURGICAL) ×2 IMPLANT
EVACUATOR DRAINAGE 10X20 100CC (DRAIN) ×2 IMPLANT
EVACUATOR SILICONE 100CC (DRAIN) ×5 IMPLANT
FILTER SMOKE EVAC LAPAROSHD (FILTER) ×1 IMPLANT
GLOVE SURG SS PI 7.5 STRL IVOR (GLOVE) ×6 IMPLANT
GOWN STRL NON-REIN LRG LVL3 (GOWN DISPOSABLE) ×2 IMPLANT
GOWN STRL REIN XL XLG (GOWN DISPOSABLE) ×6 IMPLANT
HANDLE STAPLE EGIA 4 XL (STAPLE) IMPLANT
HOVERMATT SINGLE USE (MISCELLANEOUS) ×3 IMPLANT
KIT BASIN OR (CUSTOM PROCEDURE TRAY) ×3 IMPLANT
MARKER SKIN DUAL TIP RULER LAB (MISCELLANEOUS) ×2 IMPLANT
NDL SPNL 22GX3.5 QUINCKE BK (NEEDLE) ×2 IMPLANT
NEEDLE SPNL 22GX3.5 QUINCKE BK (NEEDLE) ×3 IMPLANT
NS IRRIG 1000ML POUR BTL (IV SOLUTION) ×3 IMPLANT
PACK UNIVERSAL I (CUSTOM PROCEDURE TRAY) ×1 IMPLANT
PENCIL BUTTON HOLSTER BLD 10FT (ELECTRODE) ×3 IMPLANT
POUCH SPECIMEN RETRIEVAL 10MM (ENDOMECHANICALS) IMPLANT
RELOAD BLUE (STAPLE) ×5 IMPLANT
RELOAD EGIA 60 MED/THCK PURPLE (STAPLE) IMPLANT
RELOAD ENDO STITCH 2.0 (ENDOMECHANICALS) ×3
RELOAD GREEN (STAPLE) ×2 IMPLANT
RELOAD STAPLE 60 BLK XTHK ART (STAPLE) IMPLANT
RELOAD STAPLE 60 MED/THCK ART (STAPLE) IMPLANT
RELOAD SUT SNGL STCH BLK 2-0 (ENDOMECHANICALS) IMPLANT
RELOAD TRI 2.0 60 XTHK VAS SUL (STAPLE) IMPLANT
SCISSORS LAP 5X35 DISP (ENDOMECHANICALS) IMPLANT
SEALANT SURGICAL APPL DUAL CAN (MISCELLANEOUS) ×3 IMPLANT
SET IRRIG TUBING LAPAROSCOPIC (IRRIGATION / IRRIGATOR) ×3 IMPLANT
SHEARS CURVED HARMONIC AC 45CM (MISCELLANEOUS) ×3 IMPLANT
SLEEVE ENDOPATH XCEL 5M (ENDOMECHANICALS) ×8 IMPLANT
SLEEVE XCEL OPT CAN 5 100 (ENDOMECHANICALS) ×1 IMPLANT
SOLUTION ANTI FOG 6CC (MISCELLANEOUS) ×3 IMPLANT
SPONGE GAUZE 4X4 12PLY (GAUZE/BANDAGES/DRESSINGS) IMPLANT
SPONGE LAP 18X18 X RAY DECT (DISPOSABLE) ×3 IMPLANT
STAPLE ECHEON FLEX 60 POW ENDO (STAPLE) ×1 IMPLANT
SUT ETHILON 2 0 PS N (SUTURE) ×3 IMPLANT
SUT MNCRL AB 4-0 PS2 18 (SUTURE) ×6 IMPLANT
SUT NOVA 0 T19/GS 22DT (SUTURE) ×2 IMPLANT
SUT RELOAD ENDO STITCH 2.0 (ENDOMECHANICALS) ×2
SUT VICRYL 0 UR6 27IN ABS (SUTURE) ×3 IMPLANT
SUTURE RELOAD ENDO STITCH 2.0 (ENDOMECHANICALS) ×2 IMPLANT
SYR 20CC LL (SYRINGE) ×1 IMPLANT
TRAY FOLEY CATH 14FRSI W/METER (CATHETERS) ×3 IMPLANT
TRAY LAP CHOLE (CUSTOM PROCEDURE TRAY) ×3 IMPLANT
TROCAR BLADELESS 15MM (ENDOMECHANICALS) ×3 IMPLANT
TROCAR BLADELESS OPT 5 100 (ENDOMECHANICALS) ×3 IMPLANT
TUBING CONNECTING 10 (TUBING) ×3 IMPLANT
TUBING ENDO SMARTCAP (MISCELLANEOUS) ×3 IMPLANT
TUBING FILTER THERMOFLATOR (ELECTROSURGICAL) ×3 IMPLANT

## 2012-10-25 NOTE — Op Note (Signed)
Preoperative Diagnosis: morbid obesity  Postoprative Diagnosis: morbid obesity  Procedure: Procedure(s): LAPAROSCOPIC GASTRIC SLEEVE RESECTION ESOPHAGOGASTRODUODENOSCOPY (EGD)   Surgeon: Glenna Fellows T   Assistants: Gaynelle Adu   Anesthesia:  General endotracheal anesthesia  Indications:   Patient is a 52 year old male with progressive morbid obesity unresponsive to medical management who presents with a BMI of 47 and multiple comorbidities including diabetes mellitus, sleep apnea, hypertension, arthritis and dyslipidemia. After extensive preoperative discussion and education detailed elsewhere we have elected to proceed with laparoscopic sleeve gastrectomy for treatment of his morbid obesity.  Procedure Detail:  Patient was brought to the operating room, placed in supine position on the operating table, and general endotracheal anesthesia induced. He received preoperative IV antibiotics and subcutaneous heparin. PAS were in place. The abdomen was widely sterilely prepped and draped. Patient time out was performed and procedure verified.  Access was obtained with a 5 mm Optiview trocar in the left upper quadrant without difficulty and pneumoperitoneum established. There was no evidence of trocar injury. Under direct vision another 5 mm trocar was placed above and to the left of the umbilicus the camera port, a 15 mm trocar above and to the right of the umbilicus and another 5 mm trocar in the right upper quadrant. Through a 5 mm subxiphoid site and a posterior retractor was placed in the left lobe of the liver elevated with excellent exposure of the stomach and hiatus. The pylorus was clearly identified and measuring 5 cm proximal to this the stomach was elevated and the lesser omentum dissected off the greater curve at this point with the Harmonic scalpel. The dissection was deepened into the lesser sac. The lesser omentum was then sequentially dissected proximally along the greater curve.  Short gastric vessels were divided. Posterior attachments to the pancreas and retroperitoneum were divided. Dissection continued proximally dividing the final short gastrics adjacent to the spleen and dissecting the fundus completely up off of the left crus which was fully dissected. We then worked back distally and dissected any further retrogastric adhesions until the stomach was completely mobilized over to the lesser curve and left gastric vessels. Initially a single firing of a portion of the 60 mm green load echelon stapler was performed beginning 4-5 cm from the pylorus and angling up toward the incisura.  A 36 French blunt bougie was then passed by anesthesia orally into the stomach without difficulty and was directed distally down to the pylorus and duodenum. The second firing also used to green load as the stomach was somewhat thick taking this up to the angularis but staying somewhat away from the bougie at this spot to not to narrow the angularis. Following this the gastrectomy was completed with several more firings of the blue load 60 mm echelon stapler hugging the bougie dilator loosely with it being able to slide and rotated easily prior to each firing. The final firing was carried just lateral to the left crus and just lateral to the esophageal fat pad. One bleeding point along the antrum was controlled with clips. The staple line was coated with Tisseel tissue sealant. I then sutured the divided edge of the lesser omentum back to the greater curve of the stapled stomach just above the staple line with a running 2-0 silk suture. Following this Dr. Andrey Campanile performed upper endoscopy and there was no evidence of leak under saline irrigation with the pouch tensely distended and the staple line appeared straight without corkscrewing and no unusual narrowing. The stomach was desufflated. We then  enlarged the 15 mm trocar site slightly and the resected stomach was removed. The abdomen was irrigated there  was no evidence of bleeding orifice or injury or other problems. The fascia at the extraction site was closed with interrupted 0 Novafil. The abdomen was reinsufflated and again no evidence of bleeding and the [facility] retractor was removed under direct vision, all CO2 removed and trochars removed. Skin incisions are closed with subcuticular Monocryl and Dermabond. Sponge needle and instrument counts were correct.  Estimated Blood Loss:  less than 50 mL         Drains: none  Blood Given: none          Specimens: portion of stomach (sleeve gastrectomy)        Complications:  * No complications entered in OR log *         Disposition: PACU - hemodynamically stable.         Condition: stable

## 2012-10-25 NOTE — Transfer of Care (Signed)
Immediate Anesthesia Transfer of Care Note  Patient: Albert Tanner  Procedure(s) Performed: Procedure(s): LAPAROSCOPIC GASTRIC SLEEVE RESECTION (N/A) ESOPHAGOGASTRODUODENOSCOPY (EGD) (N/A)  Patient Location: PACU  Anesthesia Type:General  Level of Consciousness: awake, alert , oriented and patient cooperative  Airway & Oxygen Therapy: Patient Spontanous Breathing and Patient connected to face mask oxygen  Post-op Assessment: Report given to PACU RN, Post -op Vital signs reviewed and stable and Patient moving all extremities X 4  Post vital signs: stable  Complications: No apparent anesthesia complications

## 2012-10-25 NOTE — Anesthesia Postprocedure Evaluation (Signed)
  Anesthesia Post-op Note  Patient: Albert Tanner  Procedure(s) Performed: Procedure(s) (LRB): LAPAROSCOPIC GASTRIC SLEEVE RESECTION (N/A) ESOPHAGOGASTRODUODENOSCOPY (EGD) (N/A)  Patient Location: PACU  Anesthesia Type: General  Level of Consciousness: awake and alert   Airway and Oxygen Therapy: Patient Spontanous Breathing  Post-op Pain: mild  Post-op Assessment: Post-op Vital signs reviewed, Patient's Cardiovascular Status Stable, Respiratory Function Stable, Patent Airway and No signs of Nausea or vomiting  Last Vitals:  Filed Vitals:   10/25/12 1110  BP: 149/97  Pulse: 69  Temp: 36.6 C  Resp:     Post-op Vital Signs: stable   Complications: No apparent anesthesia complications

## 2012-10-25 NOTE — Anesthesia Preprocedure Evaluation (Addendum)
Anesthesia Evaluation  Patient identified by MRN, date of birth, ID band Patient awake    Reviewed: Allergy & Precautions, H&P , NPO status , Patient's Chart, lab work & pertinent test results  Airway Mallampati: III TM Distance: <3 FB Neck ROM: Full    Dental no notable dental hx.    Pulmonary sleep apnea ,  breath sounds clear to auscultation  + decreased breath sounds      Cardiovascular hypertension, Pt. on medications Rhythm:Regular Rate:Normal     Neuro/Psych negative neurological ROS  negative psych ROS   GI/Hepatic negative GI ROS, Neg liver ROS,   Endo/Other  diabetesMorbid obesity  Renal/GU negative Renal ROS  negative genitourinary   Musculoskeletal negative musculoskeletal ROS (+)   Abdominal   Peds negative pediatric ROS (+)  Hematology negative hematology ROS (+)   Anesthesia Other Findings   Reproductive/Obstetrics negative OB ROS                          Anesthesia Physical Anesthesia Plan  ASA: III  Anesthesia Plan: General   Post-op Pain Management:    Induction: Intravenous  Airway Management Planned: Oral ETT  Additional Equipment:   Intra-op Plan:   Post-operative Plan: Extubation in OR  Informed Consent: I have reviewed the patients History and Physical, chart, labs and discussed the procedure including the risks, benefits and alternatives for the proposed anesthesia with the patient or authorized representative who has indicated his/her understanding and acceptance.   Dental advisory given  Plan Discussed with: CRNA and Surgeon  Anesthesia Plan Comments:         Anesthesia Quick Evaluation

## 2012-10-25 NOTE — Op Note (Signed)
Preoperative diagnosis: Morbid obesity  Postoperative diagnosis: Same, esophagitis   Procedure: Upper endoscopy   Surgeon: Mary Sella. Kymani Laursen M.D., FACS   Anesthesia: Gen.   Indications for procedure: 52year old WM undergoing Laparoscopic Gastric Sleeve Resection and an EGD was requested to evaluate the new gastric sleeve.   Description of procedure: After we have completed the sleeve resection, I scrubbed out and obtained the Olympus endoscope. I gently placed endoscope in the patient's oropharynx and gently glided it down the esophagus without any difficulty under direct visualization. There appeared to be some mucosal irritation above the GE junction c/w esophagitis. Once I was in the gastric sleeve, I insufflated the stomach with air. I was able to cannulate and advanced the scope through the gastric sleeve. I was able to cannulate the duodenum with ease. Dr. Johna Sheriff had placed saline in the upper abdomen. Upon further insufflation of the gastric sleeve there was no evidence of bubbles. Upon further inspection of the gastric sleeve, the mucosa appeared normal. There is no evidence of any mucosal abnormality. There was no evidence of bleeding. There was no evidence of sleeve torsion. There appeared to be adequate space at the angularis. The gastric sleeve was decompressed. The scope was withdrawn. The patient tolerated this portion of the procedure well. Please see Dr Jamse Mead operative note for details regarding the laparoscopic gastric sleeve resection.   Mary Sella. Andrey Campanile, MD, FACS  General, Bariatric, & Minimally Invasive Surgery  [facility], [place]

## 2012-10-25 NOTE — H&P (View-Only) (Signed)
Chief complaint: Morbid obesity, preop sleeve gastrectomy  History: Patient returns for his preop visit prior to planned laparoscopic sleeve gastrectomy. At his initial visit he had elected to proceed with Roux-en-Y gastric bypass but was not completely certain about his decision. Since then he has done extensive research on surgical options in addition to what we discussed in the office. He expressed very clear understanding of the differences between the procedures and he feels at this point he would be most comfortable with the sleeve gastrectomy as it is somewhat less extensive than the Roux-en-Y gastric bypass but does not require repeated office visits and fills such as the band. We went over the procedure again in detail. He has been through the complete consent form were reviewed this and all his questions were answered.  Past Medical History  Diagnosis Date  . Arthritis   . Diabetes mellitus without complication   . Hypertension    Past Surgical History  Procedure Laterality Date  . Meniscus repair  03/2010  . Breath tek h pylori N/A 09/09/2012    Procedure: BREATH TEK H PYLORI;  Surgeon: Tauri Ethington T Tyreon Frigon, MD;  Location: [facility];  Service: General;  Laterality: N/A;   Current Outpatient Prescriptions  Medication Sig Dispense Refill  . aspirin 81 MG tablet Take 81 mg by mouth daily.      . lisinopril (PRINIVIL,ZESTRIL) 10 MG tablet Take 10 mg by mouth daily.      . metFORMIN (GLUCOPHAGE) 500 MG tablet Take 500 mg by mouth daily.      . naproxen (NAPROSYN) 500 MG tablet Take 500 mg by mouth daily.      . TACLONEX ointment Apply 1 application topically daily.        No current facility-administered medications for this visit.   No Known Allergies History  Substance Use Topics  . Smoking status: Never Smoker   . Smokeless tobacco: Never Used  . Alcohol Use: Yes     Comment: Social.   Exam: BP 120/82  Pulse 92  Temp(Src) 98.1 F (36.7 C) (Oral)  Resp 14  Ht 5' 11"  (1.803 m)  Wt 343 lb 9.6 oz (155.856 kg)  BMI 47.94 kg/m2 General: Obese but otherwise well-appearing Caucasian male Skin: Patchy scaly rash over his knees and elbows and a few small patches on his abdomen consistent with psoriasis HEENT: No palpable masses or thyromegaly. Sclera nonicteric. Lungs: Clear equal breath sounds without increased work of breathing or wheezing Cardiac: Regular rate and rhythm. No murmurs. No edema. Abdomen: Obese. Soft and nontender. No discernible masses or organomegaly. Extremities: No edema or joint swelling or cyanosis Neurologic: Alert and fully oriented. Affect normal. Gait normal.  Review preoperative studies today. Ultrasound of the head was unremarkable. He successfully completed nutrition and psychological evaluation. Lab work significant for a hemoglobin A1c of 6.6 and mildly elevated cholesterol.  Assessment and plan: A 52-year-old male with progressive morbid obesity unresponsive to medical management and multiple comorbidities including diabetes mellitus, sleep apnea, dyslipidemia, hypertension and chronic joint pain. We plan to proceed with sleeve gastrectomy. The only other workup he needs with his change in plan is upper GI series and we will get this done shortly. His surgery is scheduled for later this month. Again all his questions were answered and the consent form was reviewed. 

## 2012-10-25 NOTE — Interval H&P Note (Signed)
History and Physical Interval Note:  10/25/2012 7:06 AM  Albert Tanner  has presented today for surgery, with the diagnosis of morbid obesity  The various methods of treatment have been discussed with the patient and family. After consideration of risks, benefits and other options for treatment, the patient has consented to  Procedure(s): LAPAROSCOPIC GASTRIC SLEEVE RESECTION (N/A) ESOPHAGOGASTRODUODENOSCOPY (EGD) (N/A) as a surgical intervention .  The patient's history has been reviewed, patient examined, no change in status, stable for surgery.  I have reviewed the patient's chart and labs.  Questions were answered to the patient's satisfaction.     Chrisandra Wiemers T

## 2012-10-26 ENCOUNTER — Encounter (HOSPITAL_COMMUNITY): Payer: Self-pay | Admitting: General Surgery

## 2012-10-26 ENCOUNTER — Inpatient Hospital Stay (HOSPITAL_COMMUNITY): Payer: 59

## 2012-10-26 LAB — CBC WITH DIFFERENTIAL/PLATELET
Basophils Relative: 0 % (ref 0–1)
Hemoglobin: 14.5 g/dL (ref 13.0–17.0)
Lymphocytes Relative: 19 % (ref 12–46)
Lymphs Abs: 1.8 10*3/uL (ref 0.7–4.0)
Monocytes Relative: 10 % (ref 3–12)
Neutro Abs: 6.8 10*3/uL (ref 1.7–7.7)
Neutrophils Relative %: 70 % (ref 43–77)
RBC: 4.95 MIL/uL (ref 4.22–5.81)
WBC: 9.7 10*3/uL (ref 4.0–10.5)

## 2012-10-26 LAB — GLUCOSE, CAPILLARY
Glucose-Capillary: 112 mg/dL — ABNORMAL HIGH (ref 70–99)
Glucose-Capillary: 133 mg/dL — ABNORMAL HIGH (ref 70–99)

## 2012-10-26 LAB — HEMOGLOBIN AND HEMATOCRIT, BLOOD
HCT: 44.6 % (ref 39.0–52.0)
Hemoglobin: 14.9 g/dL (ref 13.0–17.0)

## 2012-10-26 LAB — BASIC METABOLIC PANEL
BUN: 8 mg/dL (ref 6–23)
Chloride: 101 mEq/L (ref 96–112)
GFR calc Af Amer: >90 mL/min (ref >90)
Potassium: 4.3 mEq/L (ref 3.5–5.1)

## 2012-10-26 MED ORDER — IOHEXOL 300 MG/ML  SOLN
50.0000 mL | Freq: Once | INTRAMUSCULAR | Status: AC | PRN
  Administered 2012-10-26: 50 mL via ORAL

## 2012-10-26 NOTE — Progress Notes (Signed)
Patient ID: Albert Tanner, male   DOB: Mar 15, 1961, 52 y.o.   MRN: [id number] 1 Day Post-Op  Subjective: No C/O this AM except incisional soreness.  Good pain control, no nausea, has been up walking in halls  Objective: Vital signs in last 24 hours: Temp:  [97.7 F (36.5 C)-99.5 F (37.5 C)] 98.6 F (37 C) (05/28 0535) Pulse Rate:  [63-89] 82 (05/28 0535) Resp:  [12-16] 16 (05/28 0535) BP: (126-173)/(80-97) 155/90 mmHg (05/28 0535) SpO2:  [94 %-100 %] 97 % (05/28 0535) Last BM Date: 10/24/12  Intake/Output from previous day: 05/27 0701 - 05/28 0700 In: 3878.3 [I.V.:3878.3] Out: 3750 [Urine:3700; Blood:50] Intake/Output this shift: Total I/O In: -  Out: 400 [Urine:400]  General appearance: alert, cooperative and no distress Resp: clear to auscultation bilaterally GI: Minimal peri incisional tenderness Incision/Wound: Clean and dry  Lab Results:   Recent Labs  10/25/12 1918 10/26/12 0418  WBC  --  9.7  HGB 13.5 14.5  HCT 41.0 45.0  PLT  --  215   BMET  Recent Labs  10/26/12 0418  NA 137  K 4.3  CL 101  CO2 29  GLUCOSE 125*  BUN 8  CREATININE 0.90  CALCIUM 8.8     Studies/Results: No results found.  Anti-infectives: Anti-infectives   Start     Dose/Rate Route Frequency Ordered Stop   10/25/12 0510  cefOXitin (MEFOXIN) 2 g in dextrose 5 % 50 mL IVPB     2 g 100 mL/hr over 30 Minutes Intravenous On call to O.R. 10/25/12 0510 10/25/12 0716      Assessment/Plan: s/p Procedure(s): LAPAROSCOPIC GASTRIC SLEEVE RESECTION ESOPHAGOGASTRODUODENOSCOPY (EGD) Doing well postop For gastrograffin swallow this AM   LOS: 1 day    Que Meneely T 10/26/2012

## 2012-10-26 NOTE — Progress Notes (Signed)
Upon arrival pt self administered CPAP, tol well. Checked humidification level-filled to appropriate line. RT will assist as needed.

## 2012-10-26 NOTE — Progress Notes (Signed)
RT went to help pt with CPAP, pt refused said that he would put it on and off himself. Pt made aware that if he had any questions or needed help to call RT we were here all night.

## 2012-10-27 LAB — CBC WITH DIFFERENTIAL/PLATELET
Basophils Absolute: 0 10*3/uL (ref 0.0–0.1)
Basophils Relative: 0 % (ref 0–1)
Hemoglobin: 13.2 g/dL (ref 13.0–17.0)
MCHC: 32.4 g/dL (ref 30.0–36.0)
Monocytes Relative: 12 % (ref 3–12)
Neutro Abs: 5.1 10*3/uL (ref 1.7–7.7)
Neutrophils Relative %: 65 % (ref 43–77)
RDW: 13.2 % (ref 11.5–15.5)

## 2012-10-27 LAB — GLUCOSE, CAPILLARY: Glucose-Capillary: 111 mg/dL — ABNORMAL HIGH (ref 70–99)

## 2012-10-27 MED ORDER — OXYCODONE-ACETAMINOPHEN 5-325 MG/5ML PO SOLN: 5.0000 mL | ORAL | Status: DC | PRN

## 2012-10-27 NOTE — Plan of Care (Signed)
Problem: Phase I Progression Outcomes Goal: OOB as tolerated unless otherwise ordered Outcome: Adequate for Discharge Pt ambulated > x4 10/26/12. Tolerated well

## 2012-10-27 NOTE — Discharge Summary (Signed)
   Patient ID: JAION LAGRANGE [id number] 52 y.o. 1960/12/30  10/25/2012  Discharge date and time: 10/27/2012   Admitting Physician: Glenna Fellows T  Discharge Physician: Glenna Fellows T  Admission Diagnoses: morbid obesity  Discharge Diagnoses: same  Operations: Procedure(s): LAPAROSCOPIC GASTRIC SLEEVE RESECTION ESOPHAGOGASTRODUODENOSCOPY (EGD)  Admission Condition: good  Discharged Condition: good  Indication for Admission: patient is a 52 year old male with morbid obesity unresponsive to medical management and multiple comorbidities who presents with a BMI of 47. After extensive preoperative workup and discussion detailed elsewhere he is electively admitted for laparoscopic sleeve gastrectomy for treatment of his morbid obesity.  Hospital Course: on the morning of admission the patient underwent an uneventful laparoscopic sleeve gastrectomy. His postoperative course was smooth. Vital signs remained stable. Hemoglobin was stable. Gastrografin swallow on the first postoperative day showed good formation of the sleeve with no leak or obstruction. He was started on water which he tolerated well. On the second postoperative day his pain is well-controlled. Abdomen is soft and nontender. Vital signs are normal. CBC is unremarkable. He is able to tolerate protein shakes. He is felt ready for discharge.  Disposition: Home  Patient Instructions:    Medication List    STOP taking these medications       HYDROcodone-acetaminophen 10-325 MG per tablet  Commonly known as:  NORCO     naproxen 500 MG tablet  Commonly known as:  NAPROSYN      TAKE these medications       aspirin 81 MG tablet  Take 81 mg by mouth every morning.     calcipotriene-betamethasone ointment  Commonly known as:  TACLONEX  Apply 1 application topically daily.     lisinopril 10 MG tablet  Commonly known as:  PRINIVIL,ZESTRIL  Take 10 mg by mouth every morning.     metFORMIN 500 MG tablet   Commonly known as:  GLUCOPHAGE  Take 500 mg by mouth daily with breakfast.     oxyCODONE-acetaminophen 5-325 MG/5ML solution  Commonly known as:  ROXICET  Take 5-10 mLs by mouth every 4 (four) hours as needed.        Activity: no heavy lifting for 3 weeks Diet: protein shakes Wound Care: none needed  Follow-up:  With Dr. Johna Sheriff in 3 weeks.  Signed: Mariella Saa MD, FACS  10/27/2012, 8:25 AM

## 2012-10-27 NOTE — Progress Notes (Signed)
Patient alert and oriented, pain is controlled. Patient is tolerating fluids, plan to advance to protein shake today.  Reviewed Gastric sleeve discharge instructions with patient and patient is able to articulate understanding.  GASTRIC BYPASS / SLEEVE  Home Care Instructions  These instructions are to help you care for yourself when you go home.  Call: If you have any problems.   Call [phone] and ask for the surgeon on call   If you need immediate assistance come to the ER at [facility]. Tell the ER staff that you are a new post-op gastric bypass or gastric sleeve patient   Signs and symptoms to report:   Severe vomiting or nausea o If you cannot handle clear liquids for longer than 1 day, call your surgeon    Abdominal pain which does not get better after taking your pain medication   Fever greater than 100.4 F and chills   Heart rate over 100 beats a minute   Trouble breathing   Chest pain    Redness, swelling, drainage, or foul odor at incision (surgical) sites    If your incisions open or pull apart   Swelling or pain in calf (lower leg)   Diarrhea (Loose bowel movements that happen often), frequent watery, uncontrolled bowel movements   Constipation, (no bowel movements for 3 days) if this happens:  o Take Milk of Magnesia, 2 tablespoons by mouth, 3 times a day for 2 days if needed o Stop taking Milk of Magnesia once you have had a bowel movement o Call your doctor if constipation continues Or o Take Miralax  (instead of Milk of Magnesia) following the label instructions o Stop taking Miralax once you have had a bowel movement o Call your doctor if constipation continues   Anything you think is "abnormal for you"   Normal side effects after surgery:   Unable to sleep at night or unable to concentrate   Irritability   Being tearful (crying) or depressed These are common complaints, possibly related to your anesthesia, stress of surgery and change in lifestyle, that  usually go away a few weeks after surgery.  If these feelings continue, call your medical doctor.  Wound Care: You may have surgical glue, steri-strips, or staples over your incisions after surgery   Surgical glue:  Looks like a clear film over your incisions and will wear off a little at a time   Steri-strips : Adhesive strips of tape over your incisions. You may notice a yellowish color on the skin under the steri-strips. This is used to make the   steri-strips stick better. Do not pull the steri-strips off - let them fall off   Staples: Staples may be removed before you leave the hospital o If you go home with staples, call [facility] at for an appointment with your surgeon's nurse to have staples removed 10 days after surgery, [phone]   Showering: You may shower two (2) days after your surgery unless your surgeon tells you differently o Wash gently around incisions with warm soapy water, rinse well, and gently pat dry  o If you have a drain (tube from your incision), you may need someone to hold this while you shower  o No tub baths until staples are removed and incisions are healed     Medications:   Medications should be liquid or crushed if larger than the size of a dime   Extended release pills (medication that releases a little bit at a time   through the day) should not be crushed   Depending on the size and number of medications you take, you may need to space (take a few throughout the day)/change the time you take your medications so that you do not over-fill your pouch (smaller stomach)   Make sure you follow-up with your primary care physician to make medication changes needed during rapid weight loss and life-style changes   If you have diabetes, follow up with the doctor that orders your diabetes medication(s) within one week after surgery and check your blood sugar regularly.   Do not drive while taking narcotics (pain medications)   Do not take acetaminophen  (Tylenol) and Roxicet or Lortab Elixir at the same time since these pain medications contain acetaminophen  Diet:                    First 2 Weeks  You will see the nutritionist about two (2) weeks after your surgery. The nutritionist will increase the types of foods you can eat if you are handling liquids well:   If you have severe vomiting or nausea and cannot handle clear liquids lasting longer than 1 day, call your surgeon  Protein Shake   Drink at least 2 ounces of shake 5-6 times per day   Each serving of protein shakes (usually 8 - 12 ounces) should have a minimum of:  o 15 grams of protein  o And no more than 5 grams of carbohydrate    Goal for protein each day: o Men = 80 grams per day o Women = 60 grams per day   Protein powder may be added to fluids such as non-fat milk or Lactaid milk or Soy milk (limit to 35 grams added protein powder per serving)  Hydration   Slowly increase the amount of water and other clear liquids as tolerated (See Acceptable Fluids)   Slowly increase the amount of protein shake as tolerated     Sip fluids slowly and throughout the day   May use sugar substitutes in small amounts (no more than 6 - 8 packets per day; i.e. Splenda)  Fluid Goal   The first goal is to drink at least 8 ounces of protein shake/drink per day (or as directed by the nutritionist); some examples of protein shakes are Syntrax Nectar, Adkins Advantage, EAS Edge HP, and Unjury. See handout from pre-op Bariatric Education Class: o Slowly increase the amount of protein shake you drink as tolerated o You may find it easier to slowly sip shakes throughout the day o It is important to get your proteins in first   Your fluid goal is to drink 64 - 100 ounces of fluid daily o It may take a few weeks to build up to this   32 oz (or more) should be clear liquids  And    32 oz (or more) should be full liquids (see below for examples)   Liquids should not contain sugar, caffeine, or  carbonation  Clear Liquids:   Water or Sugar-free flavored water (i.e. Fruit H2O, Propel)   Decaffeinated coffee or tea (sugar-free)   Crystal Lite, Wyler's Lite, Minute Maid Lite   Sugar-free Jell-O   Bouillon or broth   Sugar-free Popsicle:   *Less than 20 calories each; Limit 1 per day  Full Liquids: Protein Shakes/Drinks + 2 choices per day of other full liquids   Full liquids must be: o No More Than 12 grams of Carbs per serving  o No   More Than 3 grams of Fat per serving   Strained low-fat cream soup   Non-Fat milk   Fat-free Lactaid Milk   Sugar-free yogurt (Dannon Lite & Fit, Greek yogurt)      Vitamins and Minerals   Start 1 day after surgery unless otherwise directed by your surgeon   2 Chewable Multivitamin / Multimineral Supplement with iron (i.e. Centrum for Adults)   Vitamin B-12, 350 - 500 micrograms sub-lingual (place tablet under the tongue) each day   Chewable Calcium Citrate with Vitamin D-3 (Example: 3 Chewable Calcium Plus 600 with Vitamin D-3) o Take 500 mg three (3) times a day for a total of 1500 mg each day o Do not take all 3 doses of calcium at one time as it may cause constipation, and you can only absorb 500 mg  at a time  o Do not mix multivitamins containing iron with calcium supplements; take 2 hours apart o Do not substitute Tums (calcium carbonate) for your calcium   Menstruating women and those at risk for anemia (a blood disease that causes weakness) may need extra iron o Talk with your doctor to see if you need more iron   If you need extra iron: Total daily Iron recommendation (including Vitamins) is 50 to 100 mg Iron/day   Do not stop taking or change any vitamins or minerals until you talk to your nutritionist or surgeon   Your nutritionist and/or surgeon must approve all vitamin and mineral supplements   Activity and Exercise: It is important to continue walking at home.  Limit your physical activity as instructed by your doctor.  During  this time, use these guidelines:   Do not lift anything greater than ten (10) pounds for at least two (2) weeks   Do not go back to work or drive until your surgeon says you can   You may have sex when you feel comfortable  o It is VERY important for male patients to use a reliable birth control method; fertility often increases after surgery  o Do not get pregnant for at least 18 months   Start exercising as soon as your doctor tells you that you can o Make sure your doctor approves any physical activity   Start with a simple walking program   Walk 5-15 minutes each day, 7 days per week.    Slowly increase until you are walking 30-45 minutes per day Consider joining our BELT program. [phone] or email [email or web address]   Special Instructions Things to remember:   Free counseling is available for you and your family through collaboration between [facility] and [employer]. Please call [phone] and leave a message   Use your CPAP when sleeping if this applies to you    [facility] has a free Bariatric Surgery Support Group that meets monthly, the 3rd Thursday, 6 pm, [facility] Classrooms You can see classes online at [email or web address]   It is very important to keep all follow up appointments with your surgeon, nutritionist, primary care physician, and behavioral health practitioner o After the first year, please follow up with your bariatric surgeon and nutritionist at least once a year in order to maintain best weight loss results [facility]: [phone] [facility]: [phone] Bariatric Nurse Coordinator: [phone]     

## 2012-11-08 ENCOUNTER — Encounter: Payer: 59 | Attending: General Surgery | Admitting: *Deleted

## 2012-11-08 DIAGNOSIS — Z713 Dietary counseling and surveillance: Secondary | ICD-10-CM | POA: Insufficient documentation

## 2012-11-08 NOTE — Patient Instructions (Addendum)
Patient to follow Phase 3A-Soft, High Protein Diet and follow-up at [facility] in 6 weeks for 2 months post-op nutrition visit for diet advancement. 

## 2012-11-08 NOTE — Progress Notes (Signed)
Bariatric Class:  Appt start time: 1730 end time:  1830.  Pre-Operative Nutrition Class  Patient was seen on 09/29/12 for Pre-Operative Bariatric Surgery [facility]   Surgery date: 10/25/12 Surgery type: Sleeve Surgeon: Biagio Quint Start weight at [facility]: 342.7 lbs (08/06/12)  Weight today: 310.5 lbs Weight change: 26.5 lbs Total weight lost: 32.2 lbs  Goal weight: 200 lbs  TANITA  BODY COMP RESULTS  09/29/12    BMI (kg/m^2) 47.0 43.3   Fat Mass (lbs) 152.5 131.5   Fat Free Mass (lbs) 184.5 179.0   Total Body Water (lbs) 135.5 131.0   Samples given per MNT protocol: Bariatric Advantage Multivitamin Lot # [id number] Exp: 10/15  Bariatric Advantage Calcium Citrate Lot # [id number] Exp: 10/15  Celebrate Vitamins Multivitamin Lot # [id number] Exp: 07/15  Premier Protein Shake Lot # [id number] Exp: 06/13/13  Corliss Marcus Protein Powder Lot # [id number] Exp: 06/15  The following the learning objective met by the patient during this course:  Identifies Pre-Op Dietary Goals and will begin 2 weeks pre-operatively  Identifies appropriate sources of fluids and proteins   States protein recommendations and appropriate sources pre and post-operatively  Identifies Post-Operative Dietary Goals and will follow for 2 weeks post-operatively  Identifies appropriate multivitamin and calcium sources  Describes the need for physical activity post-operatively and will follow MD recommendations  States when to call healthcare provider regarding medication questions or post-operative complications  Handouts given during class include:  Pre-Op Bariatric Surgery Diet Handout  Protein Shake Handout  Post-Op Bariatric Surgery Nutrition Handout  BELT Program Information Flyer  Support Group Information Flyer  [facility] Bariatric Supplements Price List  Follow-Up Plan: Patient will follow-up at [facility] 2 weeks post operatively for diet advancement per  MD.

## 2012-11-10 ENCOUNTER — Encounter (INDEPENDENT_AMBULATORY_CARE_PROVIDER_SITE_OTHER): Payer: Self-pay | Admitting: General Surgery

## 2012-11-10 ENCOUNTER — Ambulatory Visit (INDEPENDENT_AMBULATORY_CARE_PROVIDER_SITE_OTHER): Payer: 59 | Admitting: General Surgery

## 2012-11-10 MED ORDER — OXYCODONE-ACETAMINOPHEN 5-325 MG/5ML PO SOLN: 5.0000 mL | ORAL | Status: DC | PRN

## 2012-11-10 NOTE — Progress Notes (Signed)
Chief complaint: Followup sleeve gastrectomy   History: Patient returns 3 weeks following laparoscopic sleeve gastrectomy for morbid obesity. He has been getting along quite well. He has transitioned from protein shakes to solid food. He feels full after several bites but is not having any significant vomiting or pain with eating. Planning on returning to work later this month. No other complaints.  Exam: BP 130/80  Pulse 72  Temp(Src) 97.9 F (36.6 C) (Oral)  Resp 18  Ht 5\' 11"  (1.803 m)  Wt 307 lb 8 oz (139.481 kg)  BMI 42.91 kg/m2 Weight loss 36 pounds from surgery Gen.: Appears well Abdomen: Soft and nontender. Incision is healing well without infection  Assessment and plan: Doing well following sleeve gastrectomy with good early weight loss and no complications identified. He is exercising daily. Return to work June 24. Return here in 3 weeks.

## 2012-12-18 NOTE — Progress Notes (Signed)
Bariatric Class:  Appt start time: 1600 end time:  1700.  2 Week Post-Operative Nutrition Class  Patient was seen on 11/08/12 for Post-Operative Nutrition education [facility].   Surgery date: 10/25/12  Surgery type: Sleeve  Start weight at [facility]: 342.7 lbs (08/06/12)   Weight today: 310.5 lbs  Weight change: 26.0 lbs  Total weight lost: 32.2 lbs   TANITA BODY COMP RESULTS   09/29/12  11/08/12  BMI (kg/m^2)  47.0  43.3  Fat Mass (lbs)  152.5  131.5  Fat Free Mass (lbs)  184.5  179.0  Total Body Water (lbs)  135.9  131.0   The following the learning objectives were met by the patient during this visit:  Identifies Phase 3A (Soft, High Proteins) Dietary Goals and will begin from 2 weeks post-operatively to 2 months post-operatively  Identifies appropriate sources of fluids and proteins   States protein recommendations and appropriate sources post-operatively  Identifies the need for appropriate texture modifications, mastication, and bite sizes when consuming solids  Identifies appropriate multivitamin and calcium sources post-operatively  Describes the need for physical activity post-operatively and will follow MD recommendations  States when to call healthcare provider regarding medication questions or post-operative complications  Handouts given during visit include:  Phase 3A: Soft, High Protein Diet Handout  Follow-Up Plan: Patient will follow-up at [facility] in 6 weeks for 2 months post-op nutrition visit for diet advancement per MD.

## 2012-12-19 ENCOUNTER — Encounter: Payer: Self-pay | Admitting: *Deleted

## 2012-12-19 ENCOUNTER — Encounter: Payer: 59 | Attending: General Surgery | Admitting: *Deleted

## 2012-12-19 DIAGNOSIS — Z713 Dietary counseling and surveillance: Secondary | ICD-10-CM | POA: Insufficient documentation

## 2012-12-19 NOTE — Patient Instructions (Addendum)
Goals:  Follow Phase 3B: High Protein + Non-Starchy Vegetables  Eat 3-6 small meals/snacks, every 3-5 hrs  Increase lean protein foods to meet 60-80g goal  Increase fluid intake to 64oz +  Avoid drinking 15 minutes before, during and 30 minutes after eating  Aim for >30 min of physical activity daily 

## 2012-12-19 NOTE — Progress Notes (Signed)
Follow-up visit:  8 Weeks Post-Operative Sleeve Gastrectomy Surgery  Medical Nutrition Therapy:  Appt start time: 415  End time:  445.  Primary concerns today: Post-operative Bariatric Surgery Nutrition Management. Albert Tanner returns for 8 week f/u with 31 lb wt loss (31.5 lb of FAT MASS). Reports his mouth gets very dry overnight with CPAP since surgery.   Surgery date: 10/25/12  Surgery type: Sleeve  Start weight at [facility]: 342.7 lbs (08/06/12)   Weight today: 277.0 lbs  Weight change: 31.0 lbs  Total weight lost: 65.7 lbs  Weight goal: 200 lbs % goal met: 46%  TANITA BODY COMP RESULTS   09/29/12  11/08/12 12/19/12  BMI (kg/m^2)  47.0  43.3 38.6  Fat Mass (lbs)  152.5  131.5 100.0  Fat Free Mass (lbs)  184.5  179.0 177.0  Total Body Water (lbs)  135.9  131.0 129.5   24-hr recall: B (AM): Protein shake OR 2 boiled eggs w/ siracha sauce (hot sauce) - 12-30g Snk (AM): Laughing Cow cheese wedge OR 1 oz deli meat - 2-8g  L (PM): 5 oz tuna or chicken w/ LF mayo with olive oil - 35g Snk (PM): NONE or same as morning snack - 0-8g D (PM): 3-5 oz lean protein - 25-30g Snk (PM): NONE  Fluid intake: 11 oz protein shake, water (16.9 oz bottle x 3-5/day) =  65-80 oz Estimated total protein intake: 75-95g   Medications: See med list - no changes reported Supplementation: Taking as directed  CBG monitoring: Not checking since surgery Average CBG per patient (pre-op): 80s - 90s Last patient reported A1c: 6.1% (pre-op - date unknown)  Using straws: No Drinking while eating: Yes. (working to d/c - this is the hardest for him) Hair loss: No Carbonated beverages: No N/V/D/C: Constipation reported; Taking miralax prn.  Dumping syndrome: None  Recent physical activity:  Walks 1 miles every day  Progress Towards Goal(s):  In progress.  Handouts given during visit include:  Phase 3B: High Protein + Non-Starchy Vegetables   Nutritional Diagnosis:  [place]-3.3 Overweight/obesity related to past poor  dietary habits and physical inactivity as evidenced by patient w/ recent Sleeve Gastrectomy surgery following dietary guidelines for continued weight loss.    Intervention:  Nutrition education/diet advancement.  Monitoring/Evaluation:  Dietary intake, exercise, and body weight. Follow up in 1 months for 3 month post-op visit.

## 2012-12-21 ENCOUNTER — Telehealth (INDEPENDENT_AMBULATORY_CARE_PROVIDER_SITE_OTHER): Payer: Self-pay

## 2012-12-21 NOTE — Telephone Encounter (Signed)
Called and left message for patient with reminder appointment date & time for 12/22/12 @ 2:00 pm w/Dr. Johna Sheriff.  Patient originally scheduled for 4:45pm but due to change in Dr. Johna Sheriff schedule appointment times have been changed.

## 2012-12-22 ENCOUNTER — Ambulatory Visit (INDEPENDENT_AMBULATORY_CARE_PROVIDER_SITE_OTHER): Payer: 59 | Admitting: General Surgery

## 2012-12-22 ENCOUNTER — Encounter (INDEPENDENT_AMBULATORY_CARE_PROVIDER_SITE_OTHER): Payer: Self-pay | Admitting: General Surgery

## 2012-12-22 NOTE — Progress Notes (Signed)
Chief complaint: Followup sleeve gastrectomy for morbid obesity  History: This returns now approximately 2 months following sleeve gastrectomy. He continues to do very well. He is experiencing very appropriate restriction but no pain or vomiting and does want to stop eating. Discontinuing moderate daily exercise. Weight loss remains excellent.  Exam: BP 142/68  Pulse 76  Resp 16  Ht 5\' 11"  (1.803 m)  Wt 272 lb 3.2 oz (123.469 kg)  BMI 37.98 kg/m2 Total weight loss 71 pounds, 35 pounds since last visit General: Appears well Abdomen: Soft and nontender. Incisions are healing well. No tenderness.  Assessment and plan: Doing very well following sleeve gastrectomy without complication identified and good ongoing weight loss. He'll return in one month.

## 2012-12-22 NOTE — Patient Instructions (Signed)
Keep up the good work

## 2012-12-23 ENCOUNTER — Ambulatory Visit (INDEPENDENT_AMBULATORY_CARE_PROVIDER_SITE_OTHER): Payer: 59 | Admitting: General Surgery

## 2013-01-19 ENCOUNTER — Ambulatory Visit: Payer: 59 | Admitting: *Deleted

## 2013-01-23 ENCOUNTER — Encounter: Payer: 59 | Attending: General Surgery | Admitting: *Deleted

## 2013-01-23 ENCOUNTER — Encounter: Payer: Self-pay | Admitting: *Deleted

## 2013-01-23 DIAGNOSIS — Z713 Dietary counseling and surveillance: Secondary | ICD-10-CM | POA: Insufficient documentation

## 2013-01-23 NOTE — Progress Notes (Signed)
Follow-up visit:  12 Weeks Post-Operative Sleeve Gastrectomy Surgery  Medical Nutrition Therapy:  Appt start time:  415  End time:  445.  Primary concerns today: Post-operative Bariatric Surgery Nutrition Management. Albert Tanner returns for 12 week f/u with 14.5 lb wt loss (17.5 lbs of FAT MASS). Continues to have very dry mouth overnight with CPAP since surgery.   Surgery date: 10/25/12  Surgery type: Sleeve  Start weight at [facility]: 342.7 lbs (08/06/12)   Weight today: 262.5 lbs  Weight change: 14.5 lbs  Total weight lost: 80.2 lbs  Weight goal: 200 lbs % goal met: 56%  TANITA BODY COMP RESULTS   09/29/12  11/08/12 12/19/12 01/23/13  BMI (kg/m^2)  47.0  43.3 38.6 36.6  Fat Mass (lbs)  152.5  131.5 100.0 82.5  Fat Free Mass (lbs)  184.5  179.0 177.0 180.0  Total Body Water (lbs)  135.9  131.0 129.5 132.0   24-hr recall: B (AM): Protein shake OR 2 boiled eggs w/ siracha sauce (hot sauce) - 12-30g Snk (AM): Laughing Cow cheese wedge OR 1 oz deli meat - 2-8g  L (PM): 3-4 oz tuna or chicken w/ LF mayo with olive oil - g Snk (PM): NONE  D (PM): 3-5 oz lean protein - 25-30g Snk (PM): Dill pickle  Fluid intake: 11 oz protein shake (2-3 days/week), water (16.9 oz bottle x 3-5/day), 12 oz reg coffee w/ FF milk, unsweet tea =  65-80 oz Estimated total protein intake: 75-95g   Medications: See med list - no changes reported Supplementation: Taking as directed  CBG monitoring: Not checking since surgery Average CBG per patient: N/A Last patient reported A1c: 6.6% (08/10/12)  Using straws: No Drinking while eating: Yes; sips. (working to d/c - this is the hardest for him) Hair loss: No Carbonated beverages: No N/V/D/C: Constipation resolving; Taking miralax prn.  Dumping syndrome: None  Recent physical activity:  Walks 1 mile every day @ moderate pace  Progress Towards Goal(s):  In progress.   Nutritional Diagnosis:  [place]-3.3 Overweight/obesity related to past poor dietary habits and  physical inactivity as evidenced by patient w/ recent Sleeve Gastrectomy surgery following dietary guidelines for continued weight loss.    Intervention:  Nutrition education/diet advancement.  Monitoring/Evaluation:  Dietary intake, exercise, and body weight. Follow up in 3 months for 6 month post-op visit.

## 2013-01-23 NOTE — Patient Instructions (Addendum)
Goals:  Follow Phase 3B: High Protein + Non-Starchy Vegetables  Eat 3-6 small meals/snacks, every 3-5 hrs  Increase lean protein foods to meet 60-80g goal  Increase fluid intake to 64oz +  Add 15 grams of carbohydrate (fruit, whole grain) with meals  Have a protein serving (1-2 oz) with all carbs (ex: fruit w/ peanut butter - Try PB2 powdered peanut butter)  Avoid drinking 15 minutes before, during and 30 minutes after eating  Aim for >30 min of physical activity daily

## 2013-01-25 ENCOUNTER — Ambulatory Visit (INDEPENDENT_AMBULATORY_CARE_PROVIDER_SITE_OTHER): Payer: 59 | Admitting: General Surgery

## 2013-01-25 ENCOUNTER — Encounter (INDEPENDENT_AMBULATORY_CARE_PROVIDER_SITE_OTHER): Payer: Self-pay | Admitting: General Surgery

## 2013-01-25 DIAGNOSIS — E119 Type 2 diabetes mellitus without complications: Secondary | ICD-10-CM

## 2013-01-25 DIAGNOSIS — G4733 Obstructive sleep apnea (adult) (pediatric): Secondary | ICD-10-CM

## 2013-01-25 DIAGNOSIS — I10 Essential (primary) hypertension: Secondary | ICD-10-CM

## 2013-01-25 DIAGNOSIS — Z6835 Body mass index (BMI) 35.0-35.9, adult: Secondary | ICD-10-CM

## 2013-01-25 NOTE — Progress Notes (Signed)
Chief complaint: Followup sleeve gastrectomy for morbid obesity  History: Patient returns for routine followup now 3 months following sleeve gastrectomy for morbid obesity with comorbidities of hypertension, diabetes on oral agents and sleep apnea. He continues to do well. He has good energy and feels he is much more mobile than before surgery. He is filling up the appropriate amount of food and denies abdominal pain or vomiting. No other complaints.  Past Medical History  Diagnosis Date  . Arthritis   . Diabetes mellitus without complication   . Hypertension   . Sleep apnea     cpap used nightly settings 16-dx. '01  . Psoriasis 10-21-12    elbows and knees   Past Surgical History  Procedure Laterality Date  . Meniscus repair  03/2010  . Breath tek h pylori N/A 09/09/2012    Procedure: BREATH TEK H PYLORI;  Surgeon: Mariella Saa, MD;  Location: [facility];  Service: General;  Laterality: N/A;  . Laparoscopic gastric sleeve resection N/A 10/25/2012    Procedure: LAPAROSCOPIC GASTRIC SLEEVE RESECTION;  Surgeon: Mariella Saa, MD;  Location: [facility];  Service: General;  Laterality: N/A;  . Esophagogastroduodenoscopy N/A 10/25/2012    Procedure: ESOPHAGOGASTRODUODENOSCOPY (EGD);  Surgeon: Mariella Saa, MD;  Location: [facility];  Service: General;  Laterality: N/A;   Current Outpatient Prescriptions  Medication Sig Dispense Refill  . aspirin 81 MG tablet Take 81 mg by mouth every morning.       . calcipotriene-betamethasone (TACLONEX) ointment Apply 1 application topically daily.      Marland Kitchen lisinopril (PRINIVIL,ZESTRIL) 10 MG tablet Take 10 mg by mouth every morning.       . metFORMIN (GLUCOPHAGE) 500 MG tablet Take 500 mg by mouth daily with breakfast.       . polyethylene glycol (MIRALAX / GLYCOLAX) packet Take 17 g by mouth daily as needed.       No current facility-administered medications for this visit.   Exam: BP 124/80  Pulse 72  Temp(Src) 97.2 F (36.2 C)  (Temporal)  Resp 16  Ht 5\' 11"  (1.803 m)  Wt 255 lb 12.8 oz (116.03 kg)  BMI 35.69 kg/m2  SpO2 98% Total weight loss 88 pounds, 16 pounds since last visit one month ago General: Appears well Skin: Mild psoriatic rash and extremities Lungs: Clear equal breath sounds Abdomen: Soft and nontender. Incisions well healed. No hernias.  Assessment and plan: Status post sleeve gastrectomy for morbid obesity with excellent weight loss and no complications identified. He has comorbidities of hypertension, diabetes and sleep apnea that remain current.  We discussed diet and exercise issues. He will return in 3 months.

## 2013-04-25 ENCOUNTER — Ambulatory Visit (INDEPENDENT_AMBULATORY_CARE_PROVIDER_SITE_OTHER): Payer: 59 | Admitting: General Surgery

## 2013-04-26 ENCOUNTER — Ambulatory Visit: Payer: 59 | Admitting: *Deleted

## 2013-05-03 ENCOUNTER — Encounter: Payer: 59 | Attending: General Surgery | Admitting: Dietician

## 2013-05-03 DIAGNOSIS — Z713 Dietary counseling and surveillance: Secondary | ICD-10-CM | POA: Insufficient documentation

## 2013-05-03 NOTE — Patient Instructions (Signed)
Goals:  Follow Phase 3B: High Protein + Non-Starchy Vegetables  Eat 3-6 small meals/snacks, every 3-5 hrs  Increase lean protein foods to meet 60-80g goal  Increase fluid intake to 64oz +  Add 15 grams of carbohydrate (fruit, whole grain) with meals  Have a protein serving (1-2 oz) with all carbs (ex: fruit w/ peanut butter - Try PB2 powdered peanut butter)  Avoid drinking 15 minutes before, during and 30 minutes after eating  Aim for >30 min of physical activity daily

## 2013-05-03 NOTE — Progress Notes (Signed)
Follow-up visit:  6 Month Post-Operative Sleeve Gastrectomy Surgery  Medical Nutrition Therapy:  Appt start time:  12:30  End time:  100.  Primary concerns today: Post-operative Bariatric Surgery Nutrition Management.  Albert Tanner returns for 6 month f/u today with a 30 pound weight loss. States he is no longer taking Metformin or using his CPAP machine (no longer has OSA). Having 1-2 beers most days.   Surgery date: 10/25/12  Surgery type: Sleeve  Start weight at [facility]: 342.7 lbs (08/06/12)   Weight today: 232.5 lbs Weight change: 30 lbs  Total weight lost: 110.2 lbs  Weight goal: 200-210 lbs % goal met: 77-83%  TANITA BODY COMP RESULTS   09/29/12  11/08/12 12/19/12 01/23/13 05/03/13  BMI (kg/m^2)  47.0  43.3 38.6 36.6 32.4  Fat Mass (lbs)  152.5  131.5 100.0 82.5 59.0  Fat Free Mass (lbs)  184.5  179.0 177.0 180.0 173.5  Total Body Water (lbs)  135.9  131.0 129.5 132.0 127.0   24-hr recall: B (AM): Protein shake Or Protein bar OR 2 boiled eggs w/ siracha sauce (hot sauce) - 12-30g Snk (AM): Laughing Cow cheese wedge OR 1 oz deli meat - 2-8g  L (PM): 3-4 oz tuna or chicken w/ LF mayo with olive oil - or protein shake or protein bar or pork and beans with apple Snk (PM): NONE  D (PM): 3-5 oz lean protein - 25-30g with green beans, pintos, baked potato, salad Snk (PM): Dill pickle  Fluid intake: unsweet tea, water, coffee with skim milk, skim milk. 1-2 Light beer (12 oz can) tomato juice and siracha.  Estimated total protein intake: 75-95g   Medications: No longer taking Metformin Supplementation: Taking as directed  CBG monitoring: Not checking since surgery Average CBG per patient: N/A Last patient reported A1c: 5.0-5.5% (a month ago)   Using straws: No Drinking while eating: Sips sometimes Hair loss: No Carbonated beverages: beer N/V/D/C: Constipation resolving; Taking miralax prn.  Dumping syndrome: None  Recent physical activity:  Walks 1 mile every day @ moderate  pace  Progress Towards Goal(s):  In progress.   Nutritional Diagnosis:  Nobles-3.3 Overweight/obesity related to past poor dietary habits and physical inactivity as evidenced by patient w/ recent Sleeve Gastrectomy surgery following dietary guidelines for continued weight loss.    Intervention:  Nutrition education/diet advancement. Recommended that [name] drink alcohol only in moderation.   Monitoring/Evaluation:  Dietary intake, exercise, and body weight. Follow up in 6 months for 12 month post-op visit.

## 2013-05-05 ENCOUNTER — Ambulatory Visit (INDEPENDENT_AMBULATORY_CARE_PROVIDER_SITE_OTHER): Payer: 59 | Admitting: General Surgery

## 2013-05-05 ENCOUNTER — Encounter (INDEPENDENT_AMBULATORY_CARE_PROVIDER_SITE_OTHER): Payer: Self-pay | Admitting: General Surgery

## 2013-05-05 ENCOUNTER — Other Ambulatory Visit (INDEPENDENT_AMBULATORY_CARE_PROVIDER_SITE_OTHER): Payer: Self-pay | Admitting: General Surgery

## 2013-05-05 VITALS — BP 128/82 | HR 87 | Temp 97.7°F | Resp 14 | Ht 71.0 in | Wt 226.0 lb

## 2013-05-05 DIAGNOSIS — Z9889 Other specified postprocedural states: Secondary | ICD-10-CM

## 2013-05-05 DIAGNOSIS — Z9884 Bariatric surgery status: Secondary | ICD-10-CM

## 2013-05-05 LAB — IRON AND TIBC: %SAT: 13 % — ABNORMAL LOW (ref 20–55)

## 2013-05-05 NOTE — Progress Notes (Signed)
Chief complaint: Followup sleeve gastrectomy  History: Patient returns for routine followup 6 months following laparoscopic sleeve gastrectomy for morbid obesity with comorbidities of diabetes and sleep apnea. He continues to get along extremely well. He has really no complaints today. Denies reflux or abdominal pain or food intolerance. He has early satiety. Bowels are moving pretty regularly. He feels much better than preoperatively. His diabetes and sleep apnea are resolved.  Exam: BP 128/82  Pulse 87  Temp(Src) 97.7 F (36.5 C) (Oral)  Resp 14  Ht 5\' 11"  (1.803 m)  Wt 226 lb (102.513 kg)  BMI 31.53 kg/m2 Total weight loss 118 pounds, 30 pounds since last visit  General: Appears well Lungs: Clear breath sounds Cardiac regular rate and rhythm. No edema Abdomen soft and nontender with well-healed  Assessment and plan: Doing very well following sleeve gastrectomy without complication identified and revision of diabetes and sleep apnea. He is exercising readily. Taking of vitamin and mineral supplements rectally. Dr. Neita Carp is checking routine lab work and we will check vitamin levels. No other recommendations other than return 3 months.

## 2013-05-06 LAB — FOLATE: Folate: >20 ng/mL

## 2013-05-06 LAB — VITAMIN B12: Vitamin B-12: 565 pg/mL (ref 211–911)

## 2013-05-09 ENCOUNTER — Telehealth (INDEPENDENT_AMBULATORY_CARE_PROVIDER_SITE_OTHER): Payer: Self-pay

## 2013-05-09 NOTE — Telephone Encounter (Signed)
Message copied by Maryan Puls on Tue May 09, 2013  9:31 AM ------      Message from: Glenna Fellows T      Created: Tue May 09, 2013  9:01 AM       Please call patient "Lab OK, iron minimallylow, needs to continue Fe supplement daily" ------

## 2013-05-09 NOTE — Telephone Encounter (Signed)
Called and left message for patient to call our office RE:  Labs OK, iron minimally low, need's to continue FE (iron) supplement daily.

## 2013-09-05 ENCOUNTER — Encounter (INDEPENDENT_AMBULATORY_CARE_PROVIDER_SITE_OTHER): Payer: Self-pay | Admitting: General Surgery

## 2013-11-02 ENCOUNTER — Ambulatory Visit: Payer: 59 | Admitting: Dietician

## 2014-06-22 ENCOUNTER — Ambulatory Visit: Payer: Self-pay | Admitting: Podiatrist

## 2014-07-20 ENCOUNTER — Encounter: Payer: Self-pay | Admitting: Podiatrist

## 2014-07-20 ENCOUNTER — Ambulatory Visit (INDEPENDENT_AMBULATORY_CARE_PROVIDER_SITE_OTHER): Payer: 59

## 2014-07-20 ENCOUNTER — Ambulatory Visit (INDEPENDENT_AMBULATORY_CARE_PROVIDER_SITE_OTHER): Payer: 59 | Admitting: Podiatrist

## 2014-07-20 VITALS — BP 114/64 | HR 69 | Resp 16

## 2014-07-20 DIAGNOSIS — M21821 Other specified acquired deformities of right upper arm: Secondary | ICD-10-CM

## 2014-07-20 DIAGNOSIS — M898X7 Other specified disorders of bone, ankle and foot: Secondary | ICD-10-CM

## 2014-07-20 DIAGNOSIS — M2570 Osteophyte, unspecified joint: Secondary | ICD-10-CM

## 2014-07-20 DIAGNOSIS — M79674 Pain in right toe(s): Secondary | ICD-10-CM

## 2014-07-20 DIAGNOSIS — M2011 Hallux valgus (acquired), right foot: Secondary | ICD-10-CM

## 2014-07-20 NOTE — Patient Instructions (Signed)
Pre-Operative Instructions  Congratulations, you have decided to take an important step to improving your quality of life.  You can be assured that the doctors of [facility] will be with you every step of the way.  1. Plan to be at the surgery center/hospital at least 1 (one) hour prior to your scheduled time unless otherwise directed by the surgical center/hospital staff.  You must have a responsible adult accompany you, remain during the surgery and drive you home.  Make sure you have directions to the surgical center/hospital and know how to get there on time. 2. For hospital based surgery you will need to obtain a history and physical form from your family physician within 1 month prior to the date of surgery- we will give you a form for you primary physician.  3. We make every effort to accommodate the date you request for surgery.  There are however, times where surgery dates or times have to be moved.  We will contact you as soon as possible if a change in schedule is required.   4. No Aspirin/Ibuprofen for one week before surgery.  If you are on aspirin, any non-steroidal anti-inflammatory medications (Mobic, Aleve, Ibuprofen) you should stop taking it 7 days prior to your surgery.  You make take Tylenol  For pain prior to surgery.  5. Medications- If you are taking daily heart and blood pressure medications, seizure, reflux, allergy, asthma, anxiety, pain or diabetes medications, make sure the surgery center/hospital is aware before the day of surgery so they may notify you which medications to take or avoid the day of surgery. 6. No food or drink after midnight the night before surgery unless directed otherwise by surgical center/hospital staff. 7. No alcoholic beverages 24 hours prior to surgery.  No smoking 24 hours prior to or 24 hours after surgery. 8. Wear loose pants or shorts- loose enough to fit over bandages, boots, and casts. 9. No slip on shoes, sneakers are best. 10. Bring  your boot with you to the surgery center/hospital.  Also bring crutches or a walker if your physician has prescribed it for you.  If you do not have this equipment, it will be provided for you after surgery. 11. If you have not been contracted by the surgery center/hospital by the day before your surgery, call to confirm the date and time of your surgery. 12. Leave-time from work may vary depending on the type of surgery you have.  Appropriate arrangements should be made prior to surgery with your employer. 13. Prescriptions will be provided immediately following surgery by your doctor.  Have these filled as soon as possible after surgery and take the medication as directed. 14. Remove nail polish on the operative foot. 15. Wash the night before surgery.  The night before surgery wash the foot and leg well with the antibacterial soap provided and water paying special attention to beneath the toenails and in between the toes.  Rinse thoroughly with water and dry well with a towel.  Perform this wash unless told not to do so by your physician.  Enclosed: 1 Ice pack (please put in freezer the night before surgery)   1 Hibiclens skin cleaner   Pre-op Instructions  If you have any questions regarding the instructions, do not hesitate to call our office.  [place]: [address] [place], [place] [address] [phone]  [place]: [address] [place], [place] [address] [phone]  [place]: [address]  [place], [place] [address] [phone]  Dr. Richard   Tuchman DPM, Dr. Norman Regal DPM Dr. Richard Sikora DPM, Dr. M. Todd Hyatt DPM, Dr. Kathryn Egerton DPM 

## 2014-07-20 NOTE — Progress Notes (Signed)
Chief Complaint  Patient presents with  . Toe Pain    1st toe right    "I got this place on my toe that my shoe rubs on that gets real sore somedays"     HPI: Patient is 54 y.o. male who presents today for a painful knot on the medial aspect of the right hallux. He states that it rubs on his steel toe shoes and causes discomfort. He also relates his hammertoes are starting to become bothersome but not as painful as the toe.   No Known Allergies  Physical Exam  Patient is awake, alert, and oriented x 3.  In no acute distress.  Vascular status is intact with palpable pedal pulses at 2/4 DP and PT bilateral and capillary refill time within normal limits. Neurological sensation is also intact bilaterally via Semmes Weinstein monofilament at 5/5 sites. Light touch, vibratory sensation, Achilles tendon reflex is intact. Dermatological exam reveals skin color, turger and texture as normal. No open lesions present.  Musculature intact with dorsiflexion, plantarflexion, inversion, eversion. Palpable exostosis is present on the medial aspect of the right hallux. X-rays show exostosis present in this area as well with some arthritic changes present. Hammertoe contractures also noted second digit bilaterally  Assessment: Exostosis medial aspect right great toe  Plan:Discussed conservative versus surgical options. Recommended a exostectomy right great toe. The consent form was discussed and all three pages were signed and the patient's questions were encouraged and answered to the best of my ability. Risks of the surgery were discussed including but not limited to continued pain, infection, swelling, elevated toe, decreased range of motion,  suture or implant reaction, bleeding, decreased function, etc. Preoperative instructions were also dispensed to the patient as well as a preoperative surgical pamphlet to go along with the instructions. Surgery will be scheduled at the patients convenience and patient [facility]  [facility].The patient is instructed to call if any questions or concerns arise.

## 2014-08-03 ENCOUNTER — Telehealth: Payer: Self-pay | Admitting: *Deleted

## 2014-08-03 NOTE — Telephone Encounter (Signed)
I called patient to see if we can reschedule his surgery date from 09/05/2014 to 09/03/2014 due to conflict in Dr. Faylene MillionEgerton's schedule.  "Sure that will be fine.  I may have to reschedule that to a few weeks later but I will let you know in a couple of weeks or so."  Okay, thank you very much.

## 2014-08-12 IMAGING — US US ABDOMEN COMPLETE
1 series · 13 of 25 positions shown · non-contrast
Comparison: CT of the abdomen and pelvis 06/10/2004.

CLINICAL DATA: Morbid obesity.

COMPLETE ABDOMINAL ULTRASOUND

[Series 1: us abdomen complete · 13 of 93 slices shown]
[im 1/93]
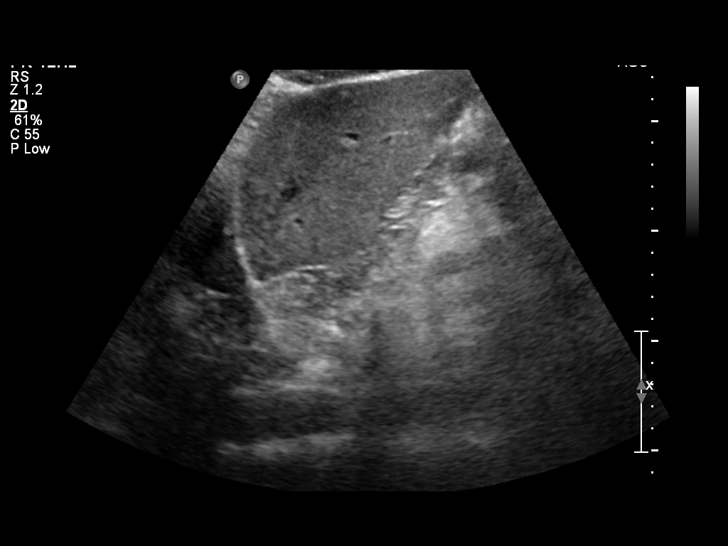
[im 8/93]
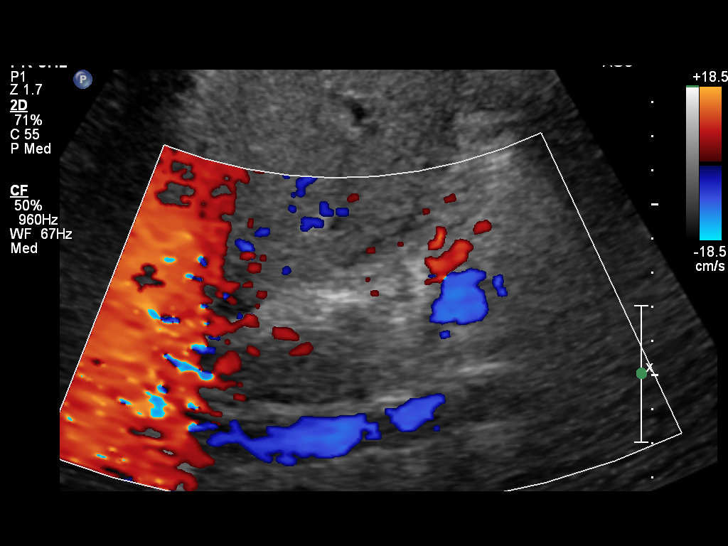
[im 16/93]
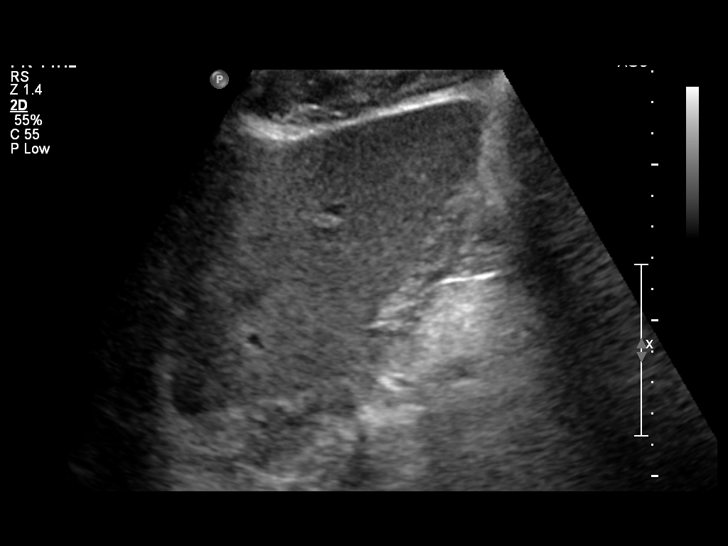
[im 24/93]
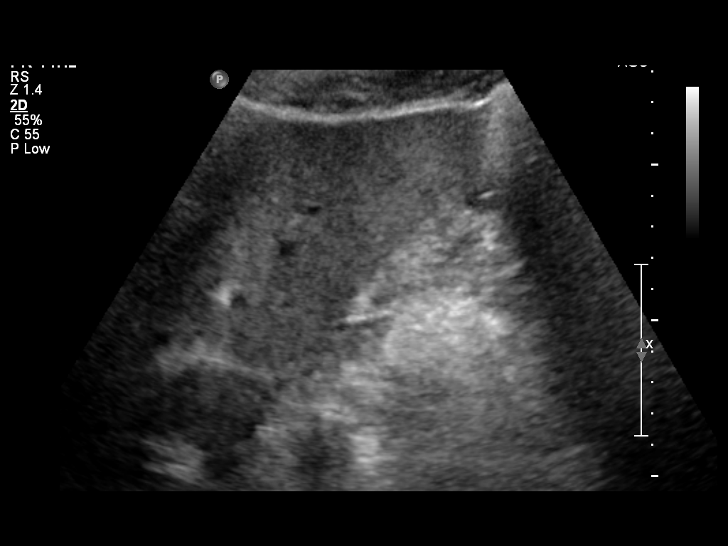
[im 31/93]
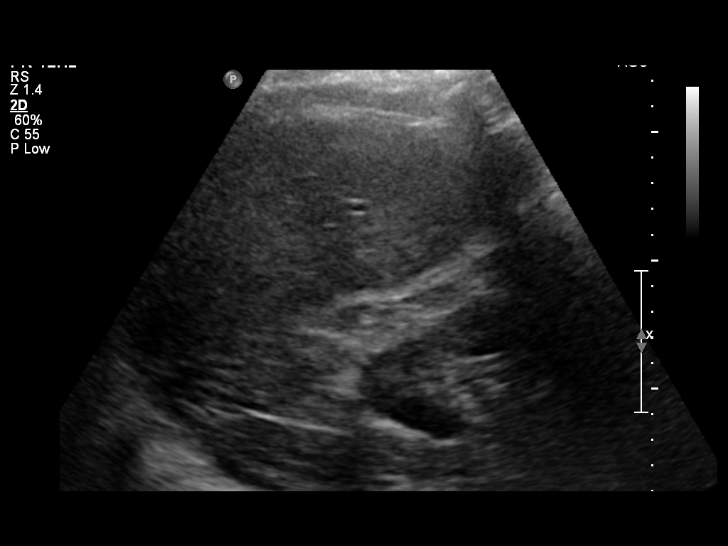
[im 39/93]
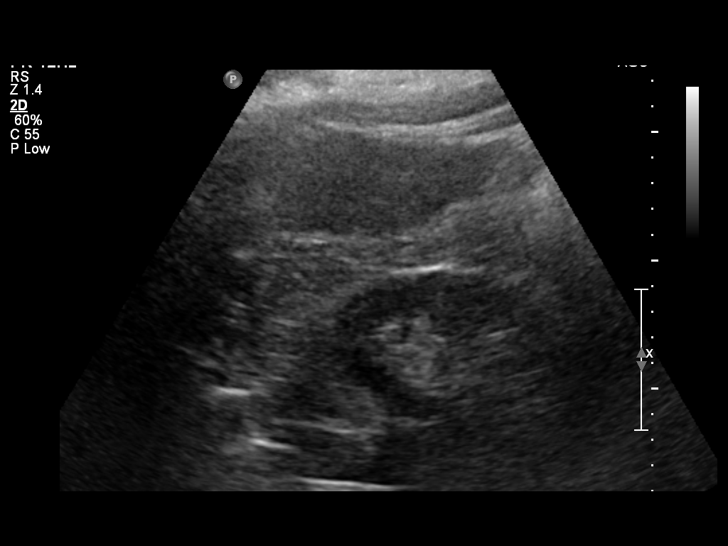
[im 47/93]
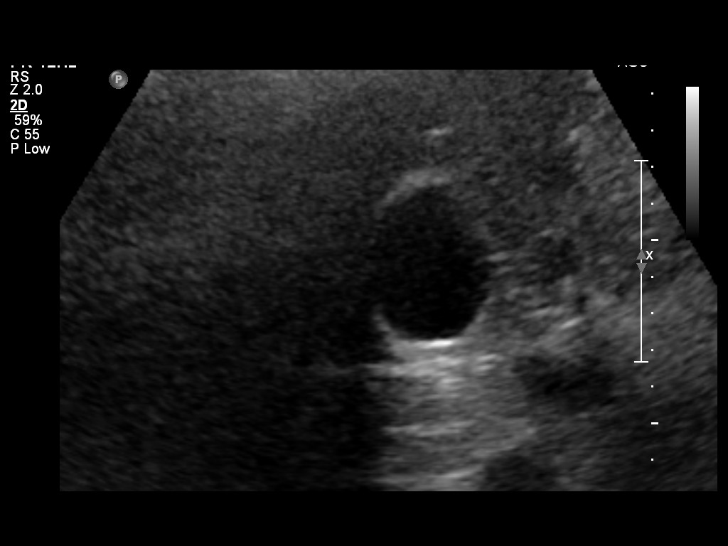
[im 54/93]
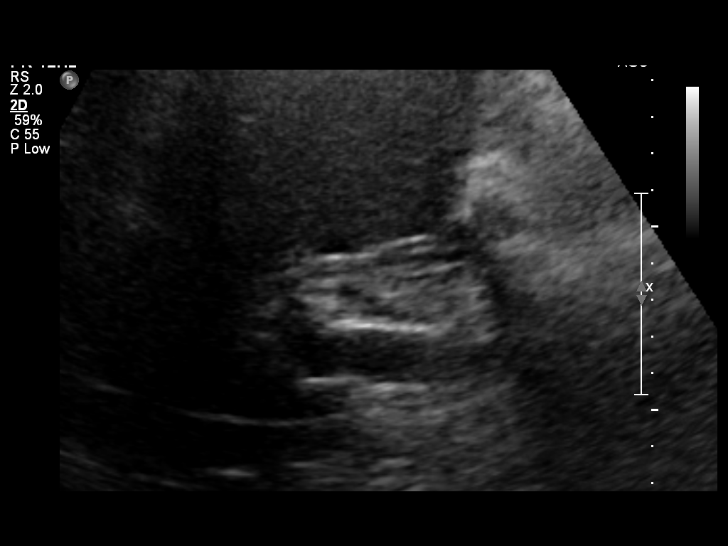
[im 62/93]
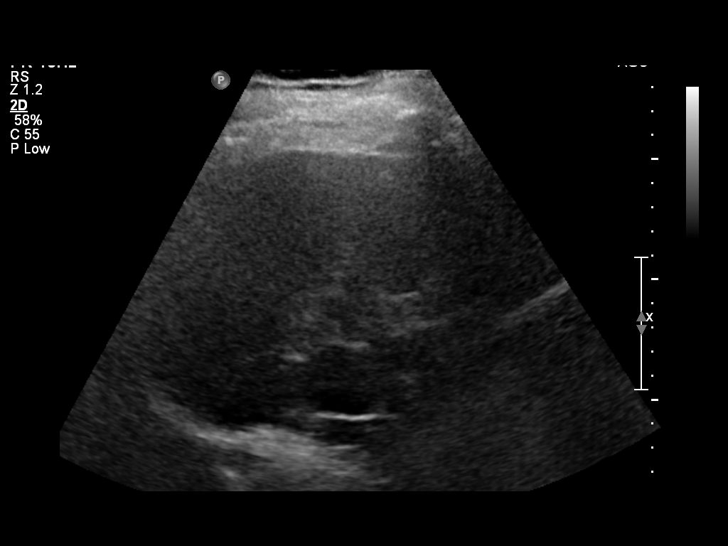
[im 70/93]
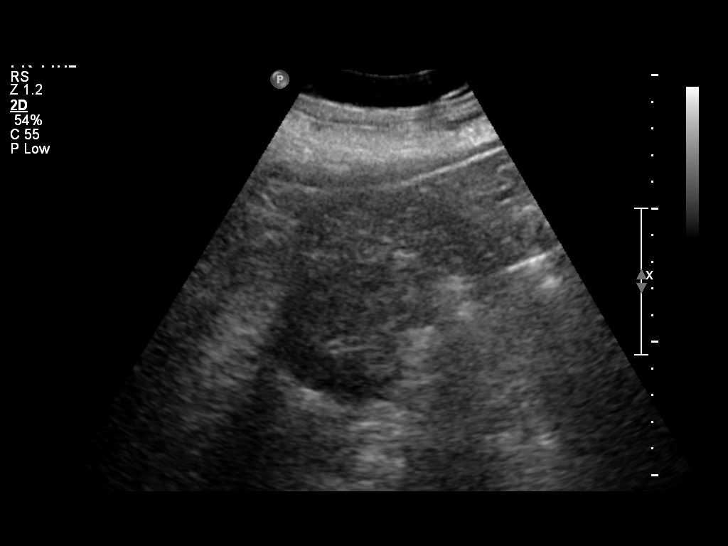
[im 77/93]
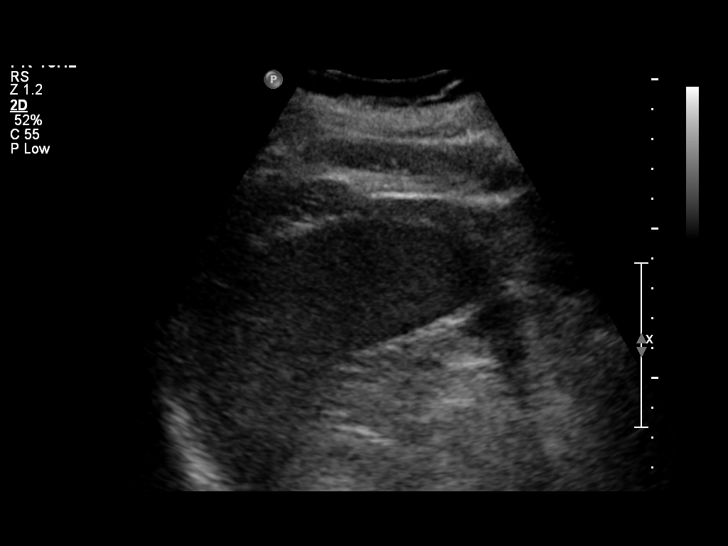
[im 85/93]
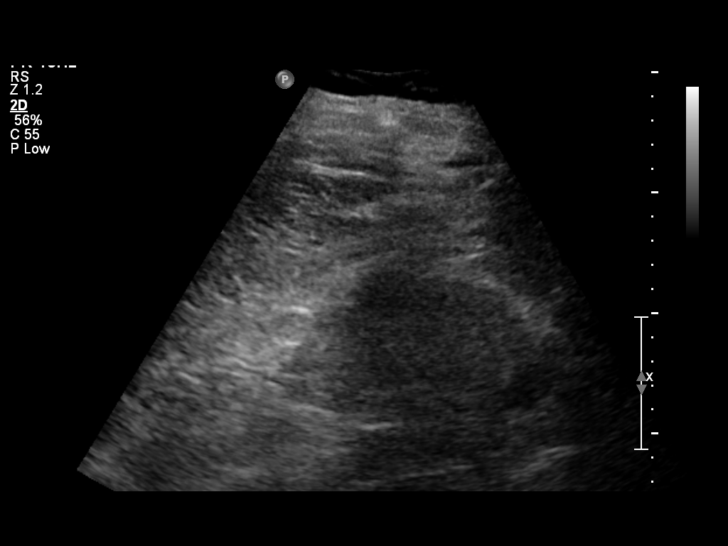
[im 93/93]
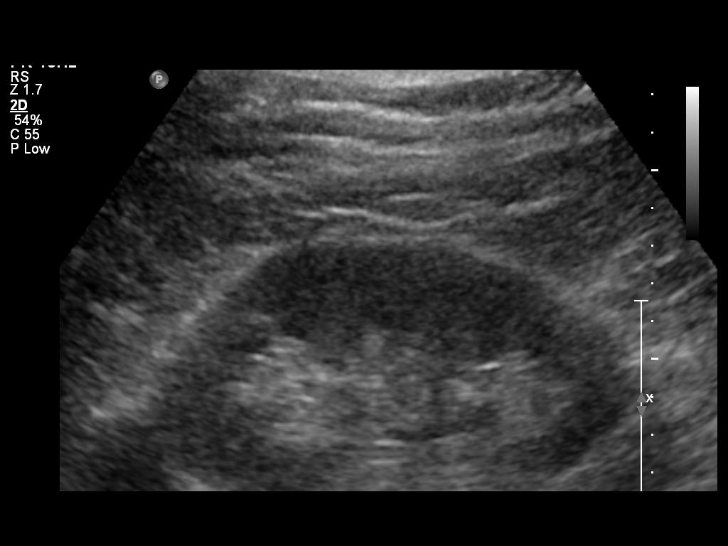

[13 of 25 positions shown; findings below may reference images not displayed]

FINDINGS: Gallbladder:  No shadowing gallstones or echogenic sludge.  No
gallbladder wall thickening or pericholecystic fluid.  Negative
sonographic Murphy's sign according to the ultrasound technologist.

Common bile duct:  Normal caliber measuring 3 mm in the porta
hepatis.

Liver:  Diffusely echogenic without focal cystic or solid hepatic
lesions.  No intrahepatic biliary ductal dilatation.  Normal
hepatopetal flow in the portal vein.

IVC:  Patent throughout its visualized course in the abdomen.

Pancreas:  Although the pancreas is difficult to visualize in its
entirety, no focal pancreatic abnormality is identified.

Spleen:  Normal size and echotexture without focal parenchymal
abnormality.7.8 cm in length.

Right Kidney:  No hydronephrosis.  Well-preserved cortex.  Normal
size and parenchymal echotexture without focal abnormalities.
cm in length.

Left Kidney:  No hydronephrosis.  Well-preserved cortex.  Normal
size and parenchymal echotexture without focal abnormalities.
cm in length.

Abdominal aorta:  Poorly visualized distally.  Proximally, the
aorta is normal in caliber measuring measuring 2.6 cm in diameter.
IMPRESSION: 1.  No acute findings.
2.  Hepatic steatosis.

## 2014-08-14 ENCOUNTER — Telehealth: Payer: Self-pay | Admitting: *Deleted

## 2014-08-14 NOTE — Telephone Encounter (Signed)
"  My surgery was moved from 09/05/2014 to 09/03/2014.  I would like to reschedule for 09/14/2014 or 09/17/2014.  Please give me a call back."    I returned his call.  "I need to reschedule my surgery about 2 weeks further out."  We can reschedule it but Dr. Irving ShowsEgerton will not be able to do it.  She will not be doing anymore surgeries after 09/03/2014.  She will probably refer you to Dr. Ardelle AntonWagoner.  He can do your surgery on 09/17/2014 because his surgery day is Mondays.  "Okay, that won't be no problem."  You may have to come in for a consultation to see Dr. Ardelle AntonWagoner.  "I have already signed papers for Dr. Irving ShowsEgerton, there's no need for me to have to come in again to sign them."  Yes, I understand that but the papers you signed are giving Dr. Irving ShowsEgerton permission to perform surgery.  "Oh, I see what you are saying.  I can come by there to just sign the forms with no problem but I don't need to see him.  She has gone over the procedure and it's just a simple removal of a spur on my right big toe."  I will see what Dr. Irving ShowsEgerton and Dr. Ardelle AntonWagoner says and give you a call back.

## 2014-08-14 NOTE — Telephone Encounter (Signed)
-----   Message from Marylou MccoyJessica L Quintana, RN sent at 08/13/2014 12:40 PM EDT ----- This pt needs to change his surgery date

## 2014-08-16 ENCOUNTER — Telehealth: Payer: Self-pay | Admitting: *Deleted

## 2014-08-16 NOTE — Telephone Encounter (Signed)
Correction, surgery is being moved from 09/03/2014 to 09/17/2014.

## 2014-08-16 NOTE — Telephone Encounter (Signed)
I can do his surgery--It is a small procedure and doesn't require a long follow up.   If you can just schedule him on a Monday (preferably around 11 am) that would be great if its available.  Thanks!

## 2014-08-16 NOTE — Telephone Encounter (Signed)
"  I'm calling you back about my surgery."  Yes, I was calling to let you know that Dr. Irving ShowsEgerton will be able to do your surgery and she said she can do it on the 08/17/2014.  "Okay,that is Monday correct?"  Yes, that is a Monday.  "Anything I need to do?"  Fill out the information needed for the surgical center which can be found in the brochure.  "I am not sure I have that but I will check when I get home."

## 2014-08-16 NOTE — Telephone Encounter (Signed)
I called and left a message for patient to call me back.  I need to inform him that Dr. Irving ShowsEgerton will be doing surgery and 08/17/2014 is okay for surgery date.

## 2014-08-17 ENCOUNTER — Telehealth: Payer: Self-pay | Admitting: Podiatrist

## 2014-08-17 NOTE — Telephone Encounter (Signed)
Pt called and has followed up with the surgery center and is planning on going ahead with the surgery on 4/18. He asked when would Dr Irving ShowsEgerton want him to follow up after surgery and I told him the 1st is ususally 1 wk later. Pt stated he was wanting to go out of town. He said again he was going to go ahead with the surgery reguardless of going out of town or not.

## 2014-08-20 ENCOUNTER — Telehealth: Payer: Self-pay | Admitting: Podiatrist

## 2014-08-20 NOTE — Telephone Encounter (Signed)
PT QUESTIONING IF IT IS OK TO TO POSTPONE FIRST POST OP UNTIL TWO WEEKS AFTER SURGERY. HE WANTS TO GO OUT OF TOWN. PLEASE CALL PATIENT

## 2014-08-22 NOTE — Telephone Encounter (Signed)
That's no problem== 2 weeks is ok with me.

## 2014-09-07 ENCOUNTER — Encounter: Payer: 59 | Admitting: Podiatrist

## 2014-09-12 ENCOUNTER — Encounter: Payer: 59 | Admitting: Podiatrist

## 2014-09-17 DIAGNOSIS — M2570 Osteophyte, unspecified joint: Secondary | ICD-10-CM | POA: Diagnosis not present

## 2014-09-21 ENCOUNTER — Encounter: Payer: 59 | Admitting: Podiatrist

## 2014-10-01 ENCOUNTER — Ambulatory Visit (INDEPENDENT_AMBULATORY_CARE_PROVIDER_SITE_OTHER): Payer: 59

## 2014-10-01 ENCOUNTER — Ambulatory Visit (INDEPENDENT_AMBULATORY_CARE_PROVIDER_SITE_OTHER): Payer: 59 | Admitting: Podiatry

## 2014-10-01 ENCOUNTER — Encounter: Payer: Self-pay | Admitting: Podiatry

## 2014-10-01 VITALS — BP 147/94 | HR 52 | Resp 11

## 2014-10-01 DIAGNOSIS — Z9889 Other specified postprocedural states: Secondary | ICD-10-CM

## 2014-10-01 DIAGNOSIS — M2570 Osteophyte, unspecified joint: Secondary | ICD-10-CM

## 2014-10-01 DIAGNOSIS — M898X7 Other specified disorders of bone, ankle and foot: Secondary | ICD-10-CM

## 2014-10-01 NOTE — Progress Notes (Signed)
Subjective:     Patient ID: Albert Tanner, male   DOB: 11/26/1960, 54 y.o.   MRN: [id number]  HPI patient states I'm doing fine with my right big toe   Review of Systems     Objective:   Physical Exam Neurovascular status intact with incision site right first hallux that's healing well with wound edges coapted and one stitch left in the middle    Assessment:     Doing well post exostectomy right big toe    Plan:     X-rays reviewed with patient and stitches removed and patient be seen back as needed

## 2014-10-11 IMAGING — CR DG CHEST 2V
2 series · 2 of 2 positions shown · non-contrast
Comparison: None.

CLINICAL DATA: Preoperative evaluation for gastric sleeve
resection.  Nonsmoker.  History of diabetes

CHEST - 2 VIEW

[w chest pa]
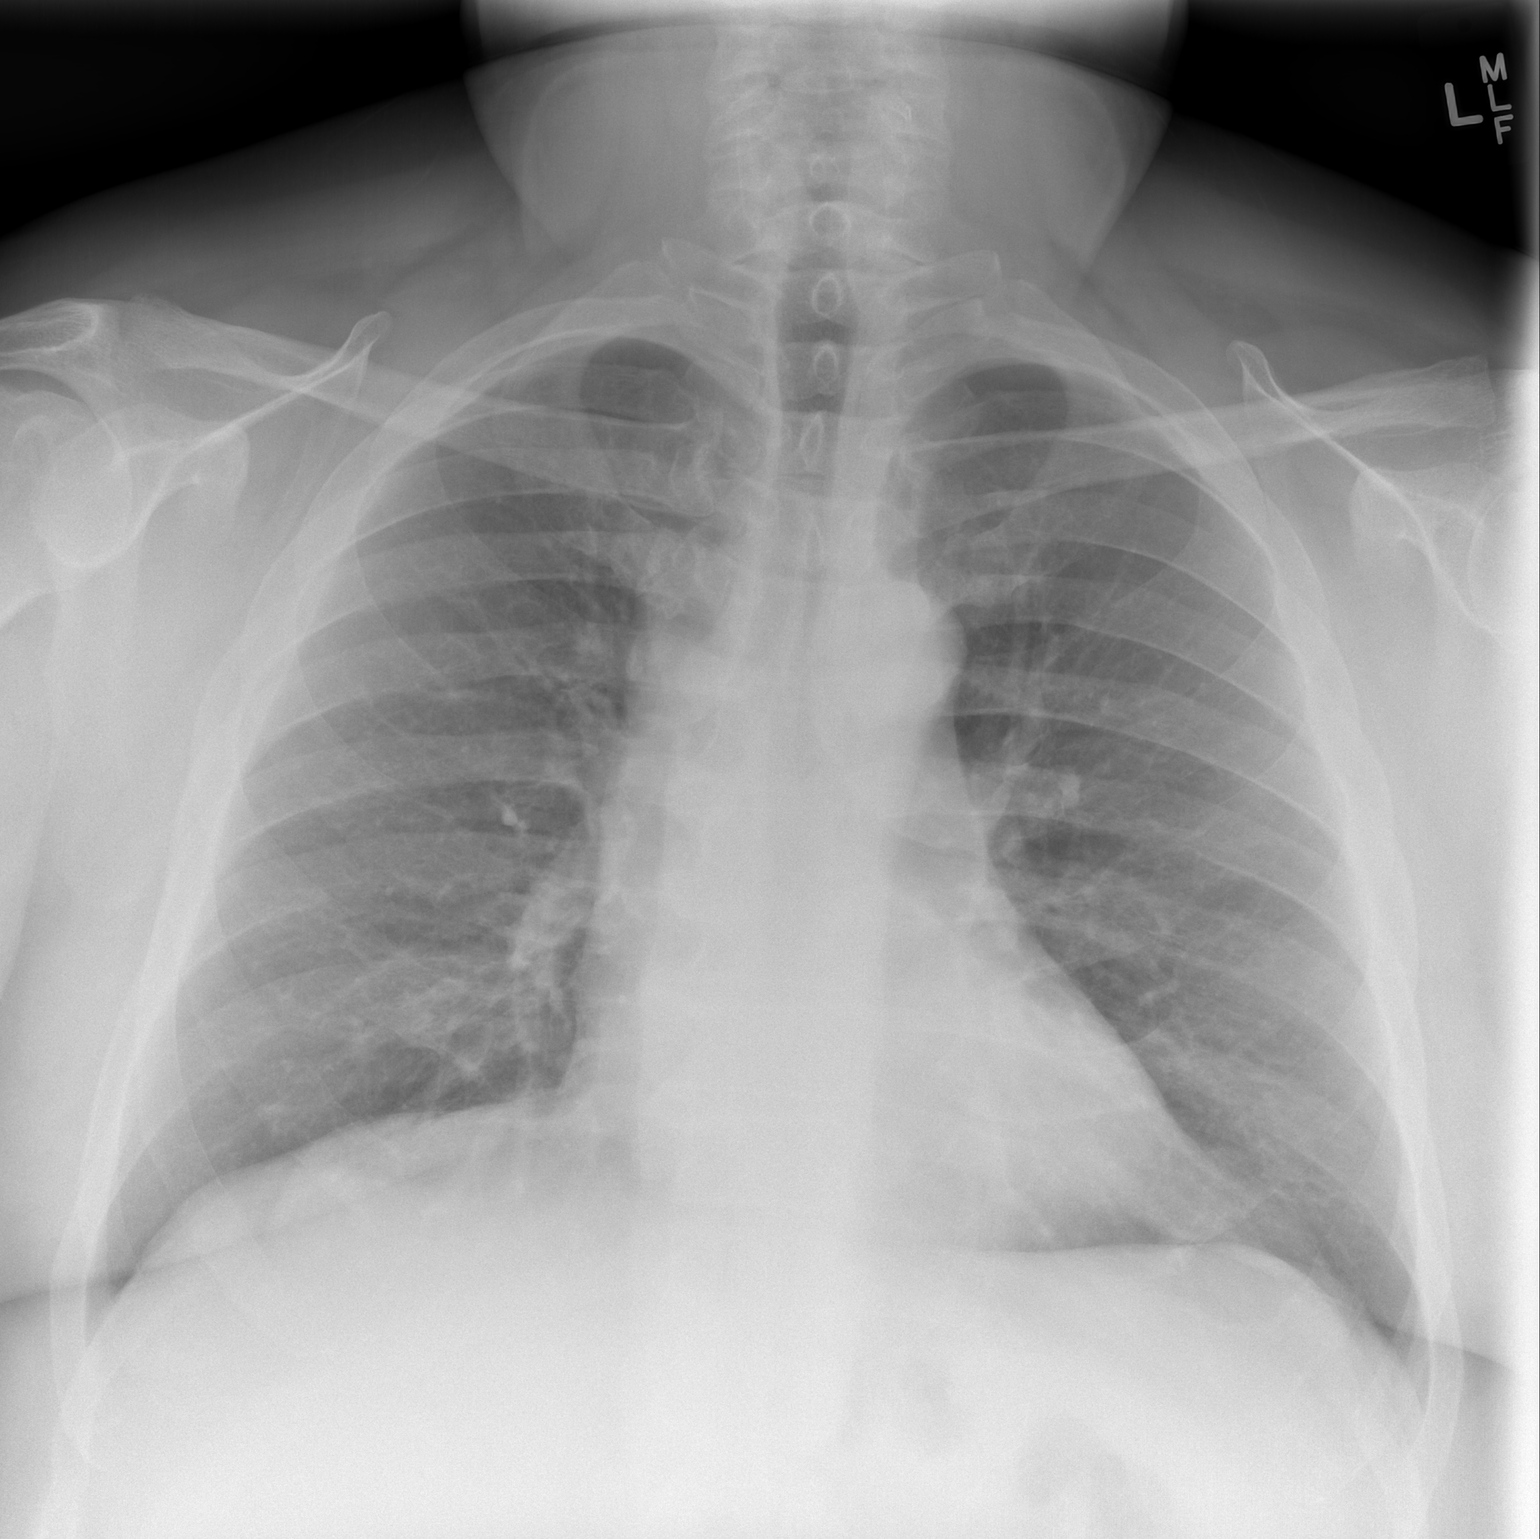

[w chest lat]
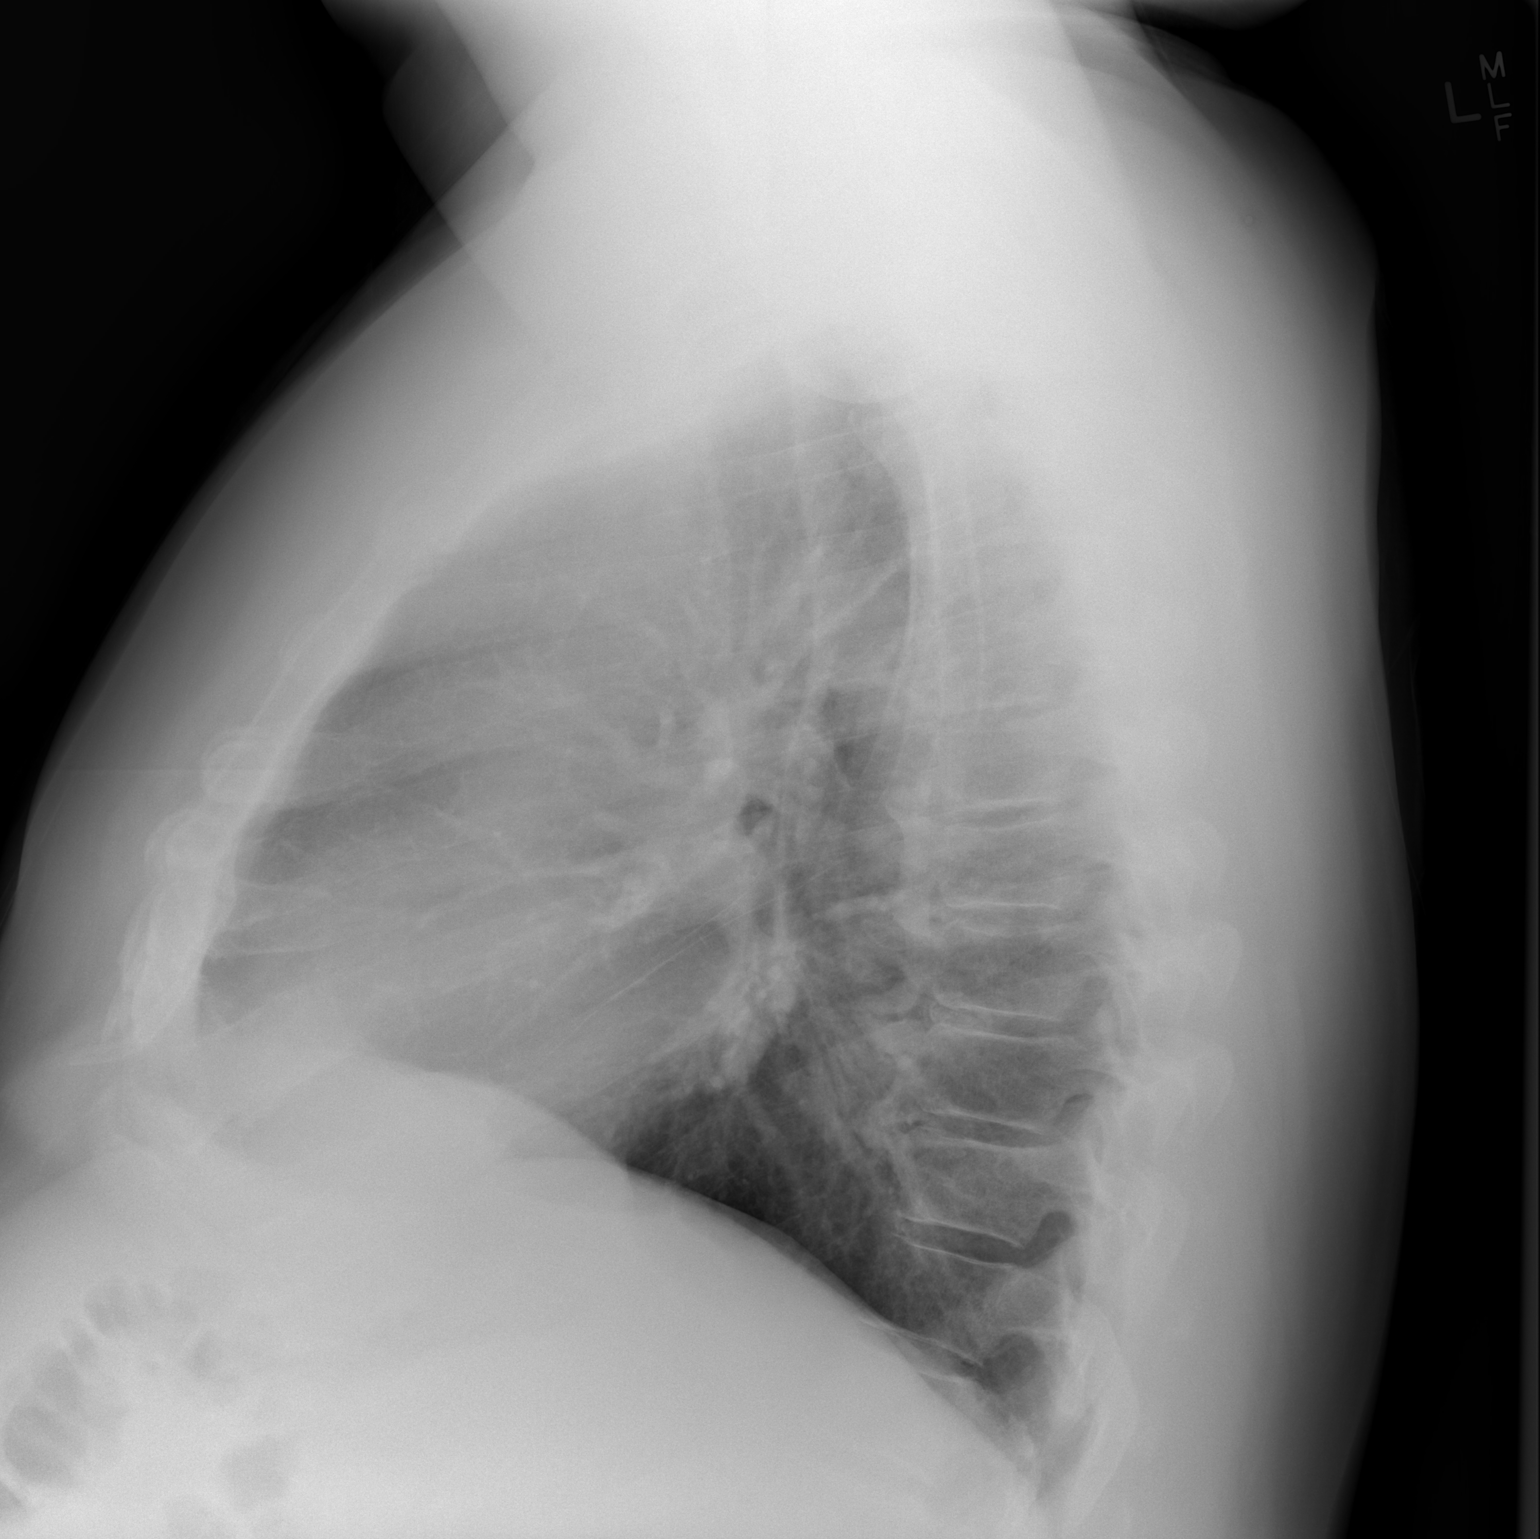

[2 of 2 positions shown; findings below may reference images not displayed]

FINDINGS: Heart size is upper normal and mediastinal contours are
within normal limits.  The lung fields appear clear with no
evidence for focal infiltrate or congestive failure.  No pleural
fluid or significant peribronchial cuffing is seen.

Bony structures demonstrate degenerative osteophytosis along the
thoracic spine are otherwise intact
IMPRESSION: Upper normal cardiac size.  No worrisome focal or acute abnormality
seen

## 2015-07-25 ENCOUNTER — Telehealth: Payer: Self-pay

## 2015-07-25 NOTE — Telephone Encounter (Signed)
Pt was on recall for 08/2015 for his next colonoscopy. He has family hx of colon cancer in his brother. Last one done by Dr. Jena Gauss 07/15/2009. Left message for pt to call.

## 2015-07-30 NOTE — Telephone Encounter (Signed)
Pt wants to wait and schedule for April. I have info to call in March to schedule when I get the April schedule.

## 2015-08-27 ENCOUNTER — Encounter: Payer: Self-pay | Admitting: Internal Medicine

## 2015-09-25 ENCOUNTER — Telehealth: Payer: Self-pay

## 2015-09-25 NOTE — Telephone Encounter (Signed)
LMOM for a return call to schedule colonoscopy.  

## 2015-10-07 NOTE — Telephone Encounter (Signed)
Letter mailed to call.  

## 2015-12-11 ENCOUNTER — Telehealth: Payer: Self-pay

## 2015-12-12 NOTE — Telephone Encounter (Signed)
Ok to schedule.

## 2015-12-12 NOTE — Telephone Encounter (Signed)
Gastroenterology Pre-Procedure Review  Request Date: 12/20/2015 Requesting Physician: ON RECALL  LAST COLONOSCOPY ON 06/09/2004 BY DR. Jena GaussOURK/ NEXT 10 YEARS UPDATE : BROTHER HAD COLON CANCER IN EARLY 60'S  PATIENT REVIEW QUESTIONS: The patient responded to the following health history questions as indicated:    1. Diabetes Melitis: no 2. Joint replacements in the past 12 months: no 3. Major health problems in the past 3 months: no 4. Has an artificial valve or MVP: no 5. Has a defibrillator: no 6. Has been advised in past to take antibiotics in advance of a procedure like teeth cleaning: no 7. Family history of colon cancer: YES, BROTHER HAD COLON CANCER IN EARLY 60'S 8. Alcohol Use: seldom 9. History of sleep apnea: no     MEDICATIONS & ALLERGIES:    Patient reports the following regarding taking any blood thinners:   Plavix? no Aspirin? yes Coumadin? no  Patient confirms/reports the following medications:  Current Outpatient Prescriptions  Medication Sig Dispense Refill  . aspirin 81 MG tablet Take 81 mg by mouth daily.    . Multiple Vitamin (MULTIVITAMIN) tablet Take 1 tablet by mouth daily.    . vitamin B-12 (CYANOCOBALAMIN) 100 MCG tablet Take 100 mcg by mouth daily.     No current facility-administered medications for this visit.    Patient confirms/reports the following allergies:  No Known Allergies  No orders of the defined types were placed in this encounter.    AUTHORIZATION INFORMATION Primary Insurance:  ID #:  Group #:  Pre-Cert / Auth required:  Pre-Cert / Auth #:   Secondary Insurance:   ID #:   Group #:  Pre-Cert / Auth required:  Pre-Cert / Auth #:   SCHEDULE INFORMATION: Procedure has been scheduled as follows:  Date:  12/20/2015                  Time:  9:45 am Location: [facility] Short Stay  This Gastroenterology Pre-Precedure Review Form is being routed to the following provider(s): R. Roetta SessionsMichael Rourk, MD

## 2015-12-13 MED ORDER — NA SULFATE-K SULFATE-MG SULF 17.5-3.13-1.6 GM/177ML PO SOLN: 1.0000 | ORAL | Status: DC

## 2015-12-13 NOTE — Telephone Encounter (Signed)
Rx sent to the pharmacy and [facility] and I confirmed they have received and stapled to the package.  Pt is aware and will come by Monday for a coupon for the Suprep. Leaving it at the front.

## 2015-12-16 ENCOUNTER — Telehealth: Payer: Self-pay

## 2015-12-16 ENCOUNTER — Other Ambulatory Visit: Payer: Self-pay

## 2015-12-16 DIAGNOSIS — Z1211 Encounter for screening for malignant neoplasm of colon: Secondary | ICD-10-CM

## 2015-12-16 NOTE — Telephone Encounter (Signed)
I called [employer] @ [phone] and spoke to Makenahris B who said a PA is required for the screening colonoscopy as outpatient. PA # [id number]. Reference # for the call today is [id number].

## 2015-12-20 ENCOUNTER — Encounter (HOSPITAL_COMMUNITY): Payer: Self-pay

## 2015-12-20 ENCOUNTER — Encounter (HOSPITAL_COMMUNITY)
Admission: RE | Disposition: A | Discharge status: Home / Self Care | Payer: Self-pay | Source: Ambulatory Visit | Attending: Internal Medicine

## 2015-12-20 ENCOUNTER — Ambulatory Visit (HOSPITAL_COMMUNITY)
Admission: RE | Admit: 2015-12-20 | Discharge: 2015-12-20 | Disposition: A | Discharge status: Home / Self Care | Payer: Commercial Managed Care - HMO | Source: Ambulatory Visit | Attending: Internal Medicine | Admitting: Internal Medicine

## 2015-12-20 DIAGNOSIS — Z1211 Encounter for screening for malignant neoplasm of colon: Secondary | ICD-10-CM | POA: Insufficient documentation

## 2015-12-20 DIAGNOSIS — K5730 Diverticulosis of large intestine without perforation or abscess without bleeding: Secondary | ICD-10-CM | POA: Insufficient documentation

## 2015-12-20 DIAGNOSIS — I10 Essential (primary) hypertension: Secondary | ICD-10-CM | POA: Insufficient documentation

## 2015-12-20 DIAGNOSIS — Z800 Family history of malignant neoplasm of digestive organs: Secondary | ICD-10-CM | POA: Diagnosis not present

## 2015-12-20 DIAGNOSIS — Z7982 Long term (current) use of aspirin: Secondary | ICD-10-CM | POA: Insufficient documentation

## 2015-12-20 DIAGNOSIS — Z79899 Other long term (current) drug therapy: Secondary | ICD-10-CM | POA: Diagnosis not present

## 2015-12-20 HISTORY — PX: COLONOSCOPY: SHX5424

## 2015-12-20 SURGERY — COLONOSCOPY
Anesthesia: Moderate Sedation

## 2015-12-20 MED ORDER — ONDANSETRON HCL 4 MG/2ML IJ SOLN
INTRAMUSCULAR | Status: DC | PRN
  Administered 2015-12-20: 4 mg via INTRAVENOUS

## 2015-12-20 MED ORDER — MIDAZOLAM HCL 5 MG/5ML IJ SOLN
INTRAMUSCULAR | Status: AC
  Filled 2015-12-20: qty 10

## 2015-12-20 MED ORDER — MEPERIDINE HCL 100 MG/ML IJ SOLN
INTRAMUSCULAR | Status: AC
  Filled 2015-12-20: qty 2

## 2015-12-20 MED ORDER — ONDANSETRON HCL 4 MG/2ML IJ SOLN
INTRAMUSCULAR | Status: AC
  Filled 2015-12-20: qty 2

## 2015-12-20 MED ORDER — STERILE WATER FOR IRRIGATION IR SOLN
Status: DC | PRN
  Administered 2015-12-20: 10:00:00

## 2015-12-20 MED ORDER — MIDAZOLAM HCL 5 MG/5ML IJ SOLN
INTRAMUSCULAR | Status: DC | PRN
  Administered 2015-12-20 (×2): 1 mg via INTRAVENOUS
  Administered 2015-12-20 (×2): 2 mg via INTRAVENOUS

## 2015-12-20 MED ORDER — MEPERIDINE HCL 100 MG/ML IJ SOLN
INTRAMUSCULAR | Status: DC | PRN
  Administered 2015-12-20: 25 mg via INTRAVENOUS
  Administered 2015-12-20: 50 mg via INTRAVENOUS

## 2015-12-20 MED ORDER — SODIUM CHLORIDE 0.9 % IV SOLN
INTRAVENOUS | Status: DC
  Administered 2015-12-20: 09:00:00 via INTRAVENOUS

## 2015-12-20 NOTE — H&P (Signed)
_0 @   Primary Care Physician:  Manon Hilding, MD Primary Gastroenterologist:  Dr. Gala Romney  Pre-Procedure History & Physical: HPI:  Albert Tanner is a 55 y.o. male is here for a screening colonoscopy. Last examination 2011. No bowel symptoms. Positive family history colon cancer in his brother-early 34s  Past Medical History  Diagnosis Date  . Arthritis   . Hypertension   . Psoriasis 10-21-12    elbows and knees    Past Surgical History  Procedure Laterality Date  . Meniscus repair  03/2010  . Breath tek h pylori N/A 09/09/2012    Procedure: BREATH TEK H PYLORI;  Surgeon: Edward Jolly, MD;  Location: [facility];  Service: General;  Laterality: N/A;  . Laparoscopic gastric sleeve resection N/A 10/25/2012    Procedure: LAPAROSCOPIC GASTRIC SLEEVE RESECTION;  Surgeon: Edward Jolly, MD;  Location: [facility];  Service: General;  Laterality: N/A;  . Esophagogastroduodenoscopy N/A 10/25/2012    Procedure: ESOPHAGOGASTRODUODENOSCOPY (EGD);  Surgeon: Edward Jolly, MD;  Location: [facility];  Service: General;  Laterality: N/A;    Prior to Admission medications   Medication Sig Start Date End Date Taking? Authorizing Provider  aspirin 81 MG tablet Take 81 mg by mouth daily.   Yes Historical Provider, MD  Multiple Vitamin (MULTIVITAMIN) tablet Take 1 tablet by mouth daily.   Yes Historical Provider, MD  Na Sulfate-K Sulfate-Mg Sulf (SUPREP BOWEL PREP KIT) 17.5-3.13-1.6 GM/180ML SOLN Take 1 kit by mouth as directed. 12/13/15  Yes Daneil Dolin, MD  vitamin B-12 (CYANOCOBALAMIN) 100 MCG tablet Take 100 mcg by mouth daily.   Yes Historical Provider, MD    Allergies as of 12/16/2015  . (No Known Allergies)    Family History  Problem Relation Age of Onset  . Dementia Mother   . Cancer Father     Lung  . Cancer Brother     Prostate  . Cancer Brother     Colon    Social History   Social History  . Marital Status: Married    Spouse Name: N/A  . Number of  Children: N/A  . Years of Education: N/A   Occupational History  . Not on file.   Social History Main Topics  . Smoking status: Never Smoker   . Smokeless tobacco: Never Used  . Alcohol Use: Yes     Comment: Social.  . Drug Use: No  . Sexual Activity: Yes   Other Topics Concern  . Not on file   Social History Narrative    Review of Systems: See HPI, otherwise negative ROS  Physical Exam: BP 124/96 mmHg  Pulse 80  Temp(Src) 98.8 F (37.1 C) (Oral)  Resp 17  Ht _1  (1.803 m)  Wt 240 lb (108.863 kg)  BMI 33.49 kg/m2  SpO2 80% General:   Alert,  Well-developed, well-nourished, pleasant and cooperative in NAD Head:  Normocephalic and atraumatic. Lungs:  Clear throughout to auscultation.   No wheezes, crackles, or rhonchi. No acute distress. Heart:  Regular rate and rhythm; no murmurs, clicks, rubs,  or gallops. Abdomen:  Soft, nontender and nondistended. No masses, hepatosplenomegaly or hernias noted. Normal bowel sounds, without guarding, and without rebound.   Msk:  Symmetrical without gross deformities. Normal posture. Pulses:  Normal pulses noted. Extremities:  Without clubbing or edema.   Impression/Plan: Albert Tanner is now here to undergo a screening colonoscopy.  High risk screening examination.  Risks, benefits, limitations, imponderables and alternatives regarding colonoscopy have been reviewed with  the patient. Questions have been answered. All parties agreeable.     Notice:  This dictation was prepared with Dragon dictation along with smaller phrase technology. Any transcriptional errors that result from this process are unintentional and may not be corrected upon review. 

## 2015-12-20 NOTE — Op Note (Signed)
[facility] Patient Name: Albert Tanner Procedure Date: 12/20/2015 8:29 AM MRN: [id number] Date of Birth: 1961/03/17 Attending MD: Gennette Pac , MD CSN: [id number] Age: 55 Admit Type: Outpatient Procedure:                Colonoscopy - high risk screening Indications:              Screening in patient at increased risk: Family                            history of 1st-degree relative with colorectal                            cancer Providers:                Gennette Pac, MD Referring MD:              Medicines:                Midazolam mg IV, Meperidine 75 mg IV, Ondansetron 4                            mg IV Complications:            No immediate complications. Estimated Blood Loss:     Estimated blood loss: none. Procedure:                Pre-Anesthesia Assessment:                           - Prior to the procedure, a History and Physical                            was performed, and patient medications and                            allergies were reviewed. The patient's tolerance of                            previous anesthesia was also reviewed. The risks                            and benefits of the procedure and the sedation                            options and risks were discussed with the patient.                            All questions were answered, and informed consent                            was obtained. Prior Anticoagulants: The patient has                            taken no previous anticoagulant or antiplatelet  agents. ASA Grade Assessment: II - A patient with                            mild systemic disease. After reviewing the risks                            and benefits, the patient was deemed in                            satisfactory condition to undergo the procedure.                           - Prior to the procedure, a History and Physical                            was performed, and patient  medications and                            allergies were reviewed. The patient's tolerance of                            previous anesthesia was also reviewed. The risks                            and benefits of the procedure and the sedation                            options and risks were discussed with the patient.                            All questions were answered, and informed consent                            was obtained. After reviewing the risks and                            benefits, the patient was deemed in satisfactory                            condition to undergo the procedure.                           After obtaining informed consent, the colonoscope                            was passed under direct vision. Throughout the                            procedure, the patient's blood pressure, pulse, and                            oxygen saturations were monitored continuously.The  colonoscopy was performed without difficulty. The                            patient tolerated the procedure well. The quality                            of the bowel preparation was adequate. Anatomical                            landmarks were photographed. The ileocecal valve,                            appendiceal orifice, and rectum were photographed.                            The [id number]) scope was introduced                            through the anus and advanced to the the cecum,                            identified by appendiceal orifice and ileocecal                            valve. Scope In: 9:54:26 AM Scope Out: 10:10:04 AM Scope Withdrawal Time: 0 hours 11 minutes 12 seconds  Total Procedure Duration: 0 hours 15 minutes 38 seconds  Findings:      The perianal and digital rectal examinations were normal.      Multiple medium-mouthed diverticula were found in the sigmoid colon.      The exam was otherwise without abnormality on direct  and retroflexion       views. Impression:               - Mild diverticulosis in the sigmoid colon.                           - The examination was otherwise normal on direct                            and retroflexion views.                           - No specimens collected. Moderate Sedation:      Moderate (conscious) sedation was administered by the endoscopy nurse       and supervised by the endoscopist. The following parameters were       monitored: oxygen saturation, heart rate, blood pressure, respiratory       rate, EKG, adequacy of pulmonary ventilation, and response to care.       Total physician intraservice time was 23 minutes. Recommendation:           - Patient has a contact number available for                            emergencies. The signs and symptoms of potential  delayed complications were discussed with the                            patient. Return to normal activities tomorrow.                            Written discharge instructions were provided to the                            patient.                           - Advance diet as tolerated.                           - Continue present medications.                           - Repeat colonoscopy in 5 years for screening                            purposes.                           - Return to GI office PRN. Procedure Code(s):        --- Professional ---                           [phone], Moderate sedation services provided by the                            same physician or other qualified health care                            professional performing the diagnostic or                            therapeutic service that the sedation supports,                            requiring the presence of an independent trained                            observer to assist in the monitoring of the                            patient's level of consciousness and physiological                             status; initial 15 minutes of intraservice time,                            patient age 24 years or older                           [phone], Moderate sedation services;  each additional                            15 minutes intraservice time Diagnosis Code(s):        --- Professional ---                           Z80.0, Family history of malignant neoplasm of                            digestive organs                           K57.30, Diverticulosis of large intestine without                            perforation or abscess without bleeding CPT copyright 2016 American Medical Association. All rights reserved. The codes documented in this report are preliminary and upon coder review may  be revised to meet current compliance requirements. Gerrit Friendsobert M. Madyson Lukach, MD Gennette Pacobert Sota Willmer Fellers, MD 12/20/2015 10:27:04 AM This report has been signed electronically. Number of Addenda: 0

## 2015-12-20 NOTE — Discharge Instructions (Signed)
°  Colonoscopy Discharge Instructions  Read the instructions outlined below and refer to this sheet in the next few weeks. These discharge instructions provide you with general information on caring for yourself after you leave the hospital. Your doctor may also give you specific instructions. While your treatment has been planned according to the most current medical practices available, unavoidable complications occasionally occur. If you have any problems or questions after discharge, call Dr. Jena Gaussourk at [phone]. ACTIVITY  You may resume your regular activity, but move at a slower pace for the next 24 hours.   Take frequent rest periods for the next 24 hours.   Walking will help get rid of the air and reduce the bloated feeling in your belly (abdomen).   No driving for 24 hours (because of the medicine (anesthesia) used during the test).    Do not sign any important legal documents or operate any machinery for 24 hours (because of the anesthesia used during the test).  NUTRITION  Drink plenty of fluids.   You may resume your normal diet as instructed by your doctor.   Begin with a light meal and progress to your normal diet. Heavy or fried foods are harder to digest and may make you feel sick to your stomach (nauseated).   Avoid alcoholic beverages for 24 hours or as instructed.  MEDICATIONS  You may resume your normal medications unless your doctor tells you otherwise.  WHAT YOU CAN EXPECT TODAY  Some feelings of bloating in the abdomen.   Passage of more gas than usual.   Spotting of blood in your stool or on the toilet paper.  IF YOU HAD POLYPS REMOVED DURING THE COLONOSCOPY:  No aspirin products for 7 days or as instructed.   No alcohol for 7 days or as instructed.   Eat a soft diet for the next 24 hours.  FINDING OUT THE RESULTS OF YOUR TEST Not all test results are available during your visit. If your test results are not back during the visit, make an appointment  with your caregiver to find out the results. Do not assume everything is normal if you have not heard from your caregiver or the medical facility. It is important for you to follow up on all of your test results.  SEEK IMMEDIATE MEDICAL ATTENTION IF:  You have more than a spotting of blood in your stool.   Your belly is swollen (abdominal distention).   You are nauseated or vomiting.   You have a temperature over 101.   You have abdominal pain or discomfort that is severe or gets worse throughout the day.   Diverticulosis information provided  Repeat screening colonoscopy in 5 years

## 2015-12-24 ENCOUNTER — Encounter (HOSPITAL_COMMUNITY): Payer: Self-pay | Admitting: Internal Medicine

## 2016-02-04 ENCOUNTER — Emergency Department (HOSPITAL_COMMUNITY)
Admission: EM | Admit: 2016-02-04 | Discharge: 2016-02-04 | Disposition: A | Discharge status: Home / Self Care | Payer: Commercial Managed Care - HMO | Attending: Emergency Medicine | Admitting: Emergency Medicine

## 2016-02-04 ENCOUNTER — Encounter (HOSPITAL_COMMUNITY): Payer: Self-pay | Admitting: Emergency Medicine

## 2016-02-04 DIAGNOSIS — Z79899 Other long term (current) drug therapy: Secondary | ICD-10-CM | POA: Diagnosis not present

## 2016-02-04 DIAGNOSIS — I10 Essential (primary) hypertension: Secondary | ICD-10-CM | POA: Insufficient documentation

## 2016-02-04 DIAGNOSIS — R45851 Suicidal ideations: Secondary | ICD-10-CM | POA: Diagnosis present

## 2016-02-04 DIAGNOSIS — F1010 Alcohol abuse, uncomplicated: Secondary | ICD-10-CM | POA: Insufficient documentation

## 2016-02-04 HISTORY — DX: Alcohol abuse, uncomplicated: F10.10

## 2016-02-04 LAB — CBC WITH DIFFERENTIAL/PLATELET
BASOS PCT: 0 %
Basophils Absolute: 0 10*3/uL (ref 0.0–0.1)
EOS ABS: 0.1 10*3/uL (ref 0.0–0.7)
Eosinophils Relative: 1 %
HCT: 48.6 % (ref 39.0–52.0)
HEMOGLOBIN: 16.9 g/dL (ref 13.0–17.0)
Lymphocytes Relative: 28 %
Lymphs Abs: 2.2 10*3/uL (ref 0.7–4.0)
MCH: 32 pg (ref 26.0–34.0)
MCHC: 34.8 g/dL (ref 30.0–36.0)
MCV: 92 fL (ref 78.0–100.0)
MONOS PCT: 7 %
Monocytes Absolute: 0.5 10*3/uL (ref 0.1–1.0)
NEUTROS PCT: 65 %
Neutro Abs: 5.2 10*3/uL (ref 1.7–7.7)
Platelets: 282 10*3/uL (ref 150–400)
RBC: 5.28 MIL/uL (ref 4.22–5.81)
RDW: 13 % (ref 11.5–15.5)
WBC: 8 10*3/uL (ref 4.0–10.5)

## 2016-02-04 LAB — COMPREHENSIVE METABOLIC PANEL
ALK PHOS: 79 U/L (ref 38–126)
ALT: 25 U/L (ref 17–63)
AST: 36 U/L (ref 15–41)
Albumin: 4.3 g/dL (ref 3.5–5.0)
Anion gap: 13 (ref 5–15)
BILIRUBIN TOTAL: 0.3 mg/dL (ref 0.3–1.2)
BUN: 11 mg/dL (ref 6–20)
CALCIUM: 9.1 mg/dL (ref 8.9–10.3)
CO2: 22 mmol/L (ref 22–32)
CREATININE: 0.84 mg/dL (ref 0.61–1.24)
Chloride: 103 mmol/L (ref 101–111)
Glucose, Bld: 217 mg/dL — ABNORMAL HIGH (ref 65–99)
Potassium: 3.7 mmol/L (ref 3.5–5.1)
Sodium: 138 mmol/L (ref 135–145)
TOTAL PROTEIN: 7.5 g/dL (ref 6.5–8.1)

## 2016-02-04 LAB — RAPID URINE DRUG SCREEN, HOSP PERFORMED
AMPHETAMINES: NOT DETECTED
BARBITURATES: NOT DETECTED
Benzodiazepines: NOT DETECTED
Cocaine: NOT DETECTED
Opiates: NOT DETECTED
Tetrahydrocannabinol: NOT DETECTED

## 2016-02-04 LAB — ETHANOL: ALCOHOL ETHYL (B): 152 mg/dL — AB (ref <5)

## 2016-02-04 MED ORDER — LORAZEPAM 1 MG PO TABS
0.0000 mg | ORAL_TABLET | Freq: Four times a day (QID) | ORAL | Status: DC
  Administered 2016-02-04: 1 mg via ORAL
  Filled 2016-02-04: qty 1

## 2016-02-04 MED ORDER — ACETAMINOPHEN 325 MG PO TABS: 650.0000 mg | ORAL_TABLET | ORAL | Status: DC | PRN

## 2016-02-04 MED ORDER — VITAMIN B-1 100 MG PO TABS
100.0000 mg | ORAL_TABLET | Freq: Every day | ORAL | Status: DC
  Administered 2016-02-04: 100 mg via ORAL
  Filled 2016-02-04: qty 1

## 2016-02-04 MED ORDER — LORAZEPAM 1 MG PO TABS: 0.0000 mg | ORAL_TABLET | Freq: Two times a day (BID) | ORAL | Status: DC

## 2016-02-04 MED ORDER — LORAZEPAM 1 MG PO TABS: 1.0000 mg | ORAL_TABLET | Freq: Three times a day (TID) | ORAL | 0 refills | Status: DC | PRN

## 2016-02-04 MED ORDER — LORAZEPAM 1 MG PO TABS
2.0000 mg | ORAL_TABLET | Freq: Once | ORAL | Status: AC
  Administered 2016-02-04: 2 mg via ORAL
  Filled 2016-02-04: qty 2

## 2016-02-04 MED ORDER — ONDANSETRON HCL 4 MG PO TABS: 4.0000 mg | ORAL_TABLET | Freq: Three times a day (TID) | ORAL | Status: DC | PRN

## 2016-02-04 MED ORDER — THIAMINE HCL 100 MG/ML IJ SOLN: 100.0000 mg | Freq: Every day | INTRAMUSCULAR | Status: DC

## 2016-02-04 NOTE — ED Notes (Signed)
Patient and family verbalize understanding of discharge instructions, prescriptions, home care and follow up care. Patient ambulatory out of department at this time with family.

## 2016-02-04 NOTE — ED Provider Notes (Signed)
[facility] DEPT Provider Note   CSN: [id number] Arrival date & time: 02/04/16  1420     History   Chief Complaint Chief Complaint  Patient presents with  . V70.1    HPI Albert Tanner is a 55 y.o. male.  The history is provided by the patient and the spouse.  Mental Health Problem  Presenting symptoms: suicidal thoughts   Degree of incapacity (severity):  Moderate Onset quality:  Gradual Duration: several days. Timing:  Constant Progression:  Worsening Chronicity:  New Context: alcohol use   Context: not drug abuse   Relieved by:  Nothing Worsened by:  Alcohol Associated symptoms: no abdominal pain and no chest pain    Patient presents with ETOH abuse and suicidal thoughts He reports h/o ETOH abuse with binges but does not drink everyday He reports today he thought about "blowing" his head off  He denies any other pain complaints   Past Medical History:  Diagnosis Date  . Arthritis   . ETOH abuse   . Hypertension   . Psoriasis 10-21-12   elbows and knees    Patient Active Problem List   Diagnosis Date Noted  . Diverticulosis of colon without hemorrhage   . 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 Morbid obesity ([place]) 07/29/2012  . DM 07/01/2009  . PSORIASIS 07/01/2009  . HEMORRHOIDS, INTERNAL 06/28/2009  . SLEEP APNEA 06/28/2009    Past Surgical History:  Procedure Laterality Date  . BREATH TEK H PYLORI N/A 09/09/2012   Procedure: BREATH TEK H PYLORI;  Surgeon: Edward Jolly, MD;  Location: [facility];  Service: General;  Laterality: N/A;  . COLONOSCOPY N/A 12/20/2015   Procedure: COLONOSCOPY;  Surgeon: Daneil Dolin, MD;  [facility];  Service: Endoscopy;  Laterality: N/A;  9:45 Am  . ESOPHAGOGASTRODUODENOSCOPY N/A 10/25/2012   Procedure: ESOPHAGOGASTRODUODENOSCOPY (EGD);  Surgeon: Edward Jolly, MD;  Location: [facility];  Service: General;  Laterality: N/A;  . LAPAROSCOPIC GASTRIC SLEEVE RESECTION N/A  10/25/2012   Procedure: LAPAROSCOPIC GASTRIC SLEEVE RESECTION;  Surgeon: Edward Jolly, MD;  Location: [facility];  Service: General;  Laterality: N/A;  . MENISCUS REPAIR  03/2010       Home Medications    Prior to Admission medications   Medication Sig Start Date End Date Taking? Authorizing Provider  aspirin 81 MG tablet Take 81 mg by mouth daily.   Yes Historical Provider, MD  Multiple Vitamin (MULTIVITAMIN) tablet Take 1 tablet by mouth daily.   Yes Historical Provider, MD  vitamin B-12 (CYANOCOBALAMIN) 100 MCG tablet Take 100 mcg by mouth daily.   Yes Historical Provider, MD  Na Sulfate-K Sulfate-Mg Sulf (SUPREP BOWEL PREP KIT) 17.5-3.13-1.6 GM/180ML SOLN Take 1 kit by mouth as directed. Patient not taking: Reported on 02/04/2016 12/13/15   Daneil Dolin, MD    Family History Family History  Problem Relation Age of Onset  . Dementia Mother   . Cancer Father     Lung  . Cancer Brother     Prostate  . Cancer Brother     Colon    Social History Social History  Substance Use Topics  . Smoking status: Never Smoker  . Smokeless tobacco: Never Used  . Alcohol use Yes     Comment: daily      Allergies   Review of patient's allergies indicates no known allergies.   Review of Systems Review of Systems  Constitutional: Negative for fever.  Cardiovascular: Negative for chest pain.  Gastrointestinal:  Negative for abdominal pain.  Musculoskeletal: Negative for back pain.  Psychiatric/Behavioral: Positive for suicidal ideas.  All other systems reviewed and are negative.    Physical Exam Updated Vital Signs BP 152/100   Pulse (!) 137   Temp 98.1 F (36.7 C) (Oral)   Resp 20   Ht 5' 11"  (1.803 m)   Wt 111.1 kg   SpO2 97%   BMI 34.17 kg/m   Physical Exam  CONSTITUTIONAL: Disheveled HEAD: Normocephalic/atraumatic EYES: EOMI ENMT: Mucous membranes dry NECK: supple no meningeal signs SPINE/BACK:entire spine nontender CV: S1/S2 noted, no murmurs/rubs/gallops  noted LUNGS: Lungs are clear to auscultation bilaterally, no apparent distress ABDOMEN: soft, nontender GU:no cva tenderness NEURO: Pt is awake/alert/appropriate, moves all extremitiesx4. Mild tremor noted EXTREMITIES: pulses normal/equal, full ROM SKIN: warm, color normal PSYCH: mildly anxious  ED Treatments / Results  Labs (all labs ordered are listed, but only abnormal results are displayed) Labs Reviewed  COMPREHENSIVE METABOLIC PANEL - Abnormal; Notable for the following:       Result Value   Glucose, Bld 217 (*)    All other components within normal limits  ETHANOL - Abnormal; Notable for the following:    Alcohol, Ethyl (B) 152 (*)    All other components within normal limits  CBC WITH DIFFERENTIAL/PLATELET  URINE RAPID DRUG SCREEN, HOSP PERFORMED    EKG  EKG Interpretation  Date/Time:  Tuesday February 04 2016 16:12:50 EDT Ventricular Rate:  116 PR Interval:    QRS Duration: 99 QT Interval:  309 QTC Calculation: 430 R Axis:   55 Text Interpretation:  Sinus tachycardia Low voltage, precordial leads Baseline wander in lead(s) V2 artifact noted Confirmed by Christy Gentles  MD, Keithen Capo ([id number]) on 02/04/2016 4:22:43 PM       Radiology No results found.  Procedures Procedures (including critical care time)  Medications Ordered in ED Medications  ondansetron (ZOFRAN) tablet 4 mg (not administered)  acetaminophen (TYLENOL) tablet 650 mg (not administered)  LORazepam (ATIVAN) tablet 0-4 mg (1 mg Oral Given 02/04/16 1736)    Followed by  LORazepam (ATIVAN) tablet 0-4 mg (not administered)  thiamine (VITAMIN B-1) tablet 100 mg (100 mg Oral Given 02/04/16 1523)    Or  thiamine (B-1) injection 100 mg ( Intravenous See Alternative 02/04/16 1523)  LORazepam (ATIVAN) tablet 2 mg (2 mg Oral Given 02/04/16 1523)     Initial Impression / Assessment and Plan / ED Course  I have reviewed the triage vital signs and the nursing notes.  Pertinent labs  results that were available during  my care of the patient were reviewed by me and considered in my medical decision making (see chart for details).  Clinical Course    Pt here with ETOH abuse and also thoughts of suicide He appears medically stable Will need psych consult 7:22 PM Pt improved He now denies SI Seen by TTS/psych and cleared for d/c with home resources After receiving ativan, vitals improved He has only mild tremor I think he is low risk for ETOH withdrawal complications as outpatient Patient/wife feel comfortable for d/c home He has PCP f/u soon, advised of hyperglycemia Short course of ativan ordered for patient  BP 148/94   Pulse 110   Temp 99.6 F (37.6 C)   Resp 18   Ht 5' 11"  (1.803 m)   Wt 111.1 kg   SpO2 98%   BMI 34.17 kg/m   Final Clinical Impressions(s) / ED Diagnoses   Final diagnoses:  Alcohol abuse    New Prescriptions  New Prescriptions   LORAZEPAM (ATIVAN) 1 MG TABLET    Take 1 tablet (1 mg total) by mouth every 8 (eight) hours as needed for anxiety.     Ripley Fraise, MD 02/04/16 [phone]

## 2016-02-04 NOTE — ED Triage Notes (Signed)
Pt originally here for for alcohol withdrawal. Pt states his last drink was this morning, he drank a 6 pack. States that over the weekend he drank a 1/2 gallon of liquor and a case of beer. States that he does not usually drink that much, except with he goes on a binge. Family began noticing tremors over the weekend. Pt also admits to having SI. States that this morning, he just wanted to blow his head off." Pt denies pmh of anxiety/depression. Pt tearful in triage.

## 2016-02-04 NOTE — ED Notes (Signed)
Pt reports he drank heavily over the weekend and now feels nervous and nausea. Pt states he said he felt suicidal this am because he was feeling so bad, but would not really hurt himself.

## 2016-02-04 NOTE — Discharge Instructions (Signed)
Substance Abuse Treatment Programs ° °Intensive Outpatient Programs °[facility]     °[address]      °[place], [place]                   °[phone]      ° °[facility] °[address] °[place], [place] °[phone] ° °[facility]     °(Inpatient and outpatient)     °[address]           °[phone]   ° °[facility] °[phone] (Suboxone and Methadone) ° °[address]      °[place], [place] [address]      °[phone]      ° °[address] °[place], [place] °852-3033 ° °[facility] (Outpatient/Inpatient, [facility])    °(insurance only) [phone]      °       °[facility] (Groups & Residential) °[place], [place] °[phone] ° °   °[facility]     °[address]     °[place], [place]      °[phone]      ° °[facility] (for caregivers and family) °[address] °[place], [place] °336-299-4655 ° ° ° ° ° °Residential Treatment Programs °[facility]      °[address], [place], [place] [address]  °[phone]      ° °[facility] °[address] [place], [place] [address] °[phone] ° °[facility]        °336-248-8914      ° °[facility] °[phone] ° °[facility]             °[address]                                         °[place], [place]                                                °877-615-2722 or 336-784-9470                              ° °[facility] [place] °[address] °[place], [address] °[phone] ° °[facility]    °[address]      °[place], [place]     °[phone]      ° °[facility] °[address] °[place], [place] °336-285-9073 ° °[facility]   °[address]     °[place], [place] [address]     °[phone]      °Admissions: 8am-3pm M-F ° °[facility] °[address] °,  [place] °336-227-7417 ° °[facility]: Residential Program (90 Days)   °[place], [place]      °336-725-8389 or 800-758-6077    ° °[facility] °[place], [place] °(Walk in Hours over the weekend or by referral) ° °[facility] °[address], [place],  [address] °([phone] ° °Crisis Mobile: [facility]:  1-877-626-1772 (for crisis response 24 hours a day) °[facility] Hotline:      1-800-256-2452 °Outpatient Psychiatry and Counseling ° °[facility]: Mobile Crisis   Management 24 hours:  1-877-626-1772 ° °[facility] sliding scale fee and walk in schedule: M-F 8am-12pm/1pm-3pm °[address]  °[place], [place] [address] °[phone] ° °[facility] °[address] °[place], [place] [address] °336-703-9650 ° °[facility] ([facility])- new patient walk-in appointments available Monday - Friday 8am -3pm.          °[address] °[place], [place] [address] °336-676-6840 or crisis line- 336-676-6905 ° °[facility] Intensive Outpatient Therapy Program °[address] °[place], [place] [address] °336-832-9804 ° °[facility]                  °Crisis Services      °[phone]      °[address]     °[place], [place] [address]                ° °[facility]   °[facility] °[phone] °[address] °[place], [place] [address] ° ° °[facility]          °[address]  °[place], [place] [address]       °([phone] ° °[facility] °[address] °[place], [place] [address] °336-292-1510 ° °[facility]    °[address]    °[place], [place] [address]     °[phone]      ° °Parish McKinney, [place]     °[address]     °[place] [address]     °336-282-1251     °  °[facility] °[address] °[place] [address] ° °[facility]     °[address]     °[place], [place]      °336-542-2076      ° °[facility] °Shamsher Ahluwalia, MD °[address] °[place], [place] [address] °336-420-9558 ° °[facility]     °[address]     °[place], [place] [address]     °336-274-1237      ° °[facility] °[address] °[place], [place] [address] °336-854-4450 °Resources for Temporary Residential Assistance/Crisis Centers ° °DAY CENTERS °[facility] °M-F 8am-3pm   °[address] [place], [place] [address]   336-332-0824 °Services include: laundry, barbering, support groups, case management, phone  & computer access, showers, AA/NA mtgs, mental health/substance abuse nurse, job skills class, disability information, VA assistance, spiritual classes, etc.  ° °HOMELESS SHELTERS ° °[facility]     °[facility]   °[address], [place]     °336.271.5959       °       °[facility] (women and children)       °[address] °[place], [place] [address] °336-275-0820 °[email or web address] for application and process °Application Required ° °[facility]   °[address]    °[place] [address]     °[phone]       °             °[facility] °[address] °, [place] [address] °336.273.5572 °336-235-0363(schedule application appt.) °Application Required ° °[facility] (women only)    °[address]     °[place],  [address]     °336-884-1039      °  Intake starts 6pm daily °Need valid ID, SSC, & Police report °[facility] [place] °[address] °[place], [place] °336-881-5420 °Application Required ° °[facility] (men only)     °[address]      °[place], [place]     °[phone]      ° °[facility] °(Pregnant women only) °[address] °[place], [place] °336-275-0206 ° °[facility]      °[address]      °[place], [place] [address]     °336-722-9951      °       °[facility] °[address] °[place], [place] °336-723-1848 °90 day commitment/SA/Application process ° °[facility](men only)     °[address]     °[place], [place]     °[phone]       °Check-in at 7pm     °       °[facility] °[address] °[place], [place] [address] °336-248-6684 °Men/Women/Women and Children must be there by 7 pm ° °[facility] °[place], [place] °336-722-8721                ° °

## 2016-02-04 NOTE — BH Assessment (Signed)
Tele Assessment Note   Albert Tanner is a 55 y.o. male who presents voluntarily to [facility], accompanied by his wife, Albert Tanner, with c/o alcohol withdrawal. Pt made a statement of wanting to blow his head off. Pt later indicated that he just said that b/c he was feeling so bad b/c of the alcohol withdrawal, but he didn't mean it and never had any intention of harming himself. Pt reiterates the same sentiment during the assessment. Pt denies any psychiatric hx, incl OP services, medication mgmt or IP treatment. Pt states that he just needs to stay away from alcohol and this is his only issue. Pt denies SI, HI, AVH. Pt's wife indicates that she has no concerns that pt is going to harm himself.   Diagnosis: Alcohol Use d/o, severe  Past Medical History:  Past Medical History:  Diagnosis Date  . Arthritis   . ETOH abuse   . Hypertension   . Psoriasis 10-21-12   elbows and knees    Past Surgical History:  Procedure Laterality Date  . BREATH TEK H PYLORI N/A 09/09/2012   Procedure: BREATH TEK H PYLORI;  Surgeon: Mariella SaaBenjamin T Hoxworth, MD;  [facility];  Service: General;  Laterality: N/A;  . COLONOSCOPY N/A 12/20/2015   Procedure: COLONOSCOPY;  Surgeon: Corbin Adeobert M Rourk, MD;  [facility];  Service: Endoscopy;  Laterality: N/A;  9:45 Am  . ESOPHAGOGASTRODUODENOSCOPY N/A 10/25/2012   Procedure: ESOPHAGOGASTRODUODENOSCOPY (EGD);  Surgeon: Mariella SaaBenjamin T Hoxworth, MD;  Location: [facility];  Service: General;  Laterality: N/A;  . LAPAROSCOPIC GASTRIC SLEEVE RESECTION N/A 10/25/2012   Procedure: LAPAROSCOPIC GASTRIC SLEEVE RESECTION;  Surgeon: Mariella SaaBenjamin T Hoxworth, MD;  Location: [facility];  Service: General;  Laterality: N/A;  . MENISCUS REPAIR  03/2010    Family History:  Family History  Problem Relation Age of Onset  . Dementia Mother   . Cancer Father     Lung  . Cancer Brother     Prostate  . Cancer Brother     Colon    Social History:  reports that he has never smoked. He  has never used smokeless tobacco. He reports that he drinks alcohol. He reports that he does not use drugs.  Additional Social History:  Alcohol / Drug Use Pain Medications: pt denies Prescriptions: pt denies Over the Counter: B12, multivitamin, aspirin History of alcohol / drug use?: Yes  CIWA: CIWA-Ar BP: 142/97 Pulse Rate: (!) 125 Nausea and Vomiting: 2 Tactile Disturbances: none Tremor: moderate, with patient's arms extended Auditory Disturbances: not present Paroxysmal Sweats: barely perceptible sweating, palms moist Visual Disturbances: not present Anxiety: moderately anxious, or guarded, so anxiety is inferred Headache, Fullness in Head: none present Agitation: moderately fidgety and restless Orientation and Clouding of Sensorium: oriented and can do serial additions CIWA-Ar Total: 15 COWS:    PATIENT STRENGTHS: (choose at least two) Average or above average intelligence Motivation for treatment/growth Supportive family/friends  Allergies: No Known Allergies  Home Medications:  (Not in a hospital admission)  OB/GYN Status:  No LMP for male patient.  General Assessment Data Location of Assessment: [facility] T[facility] Assessment: In system Is this a Tele or Face-to-Face Assessment?: Tele Assessment Is this an Initial Assessment or a Re-assessment for this encounter?: Initial Assessment Marital status: Married Living Arrangements: Spouse/significant other, Other relatives Can pt return to current living arrangement?: Yes Admission Status: Voluntary Is patient capable of signing voluntary admission?: Yes Referral Source: Self/Family/Friend [facility]     Crisis Care Plan  Living Arrangements: Spouse/significant other, Other relatives Name of Psychiatrist: none Name of Therapist: none  Education Status Is patient currently in school?: No  Risk to self with the past 6 months Suicidal Ideation: No-Not Currently/Within Last 6 Months Has patient been a risk to  self within the past 6 months prior to admission? : No Suicidal Intent: No Has patient had any suicidal intent within the past 6 months prior to admission? : No Is patient at risk for suicide?: No Suicidal Plan?: No Has patient had any suicidal plan within the past 6 months prior to admission? : No Access to Means: No What has been your use of drugs/alcohol within the last 12 months?: pt binges on alcohol Previous Attempts/Gestures: No Intentional Self Injurious Behavior: None Family Suicide History: No Persecutory voices/beliefs?: No Depression: No Depression Symptoms: Feeling angry/irritable Substance abuse history and/or treatment for substance abuse?: Yes Suicide prevention information given to non-admitted patients: Not applicable  Risk to Others within the past 6 months Homicidal Ideation: No Does patient have any lifetime risk of violence toward others beyond the six months prior to admission? : No Thoughts of Harm to Others: No Current Homicidal Intent: No Current Homicidal Plan: No Access to Homicidal Means: No History of harm to others?: No Assessment of Violence: None Noted Does patient have access to weapons?: No Criminal Charges Pending?: No Does patient have a court date: No Is patient on probation?: No  Psychosis Hallucinations: None noted Delusions: None noted  Mental Status Report Appearance/Hygiene: Unremarkable Eye Contact: Good Motor Activity: Unremarkable Speech: Logical/coherent Level of Consciousness: Alert Mood: Pleasant Affect: Appropriate to circumstance Anxiety Level: None Thought Processes: Relevant, Coherent Judgement: Unimpaired Orientation: Person, Place, Situation, Time Obsessive Compulsive Thoughts/Behaviors: None  Cognitive Functioning Concentration: Normal Memory: Recent Intact, Remote Intact IQ: Average Insight: Good Impulse Control: Good Appetite: Good Sleep: No Change Vegetative Symptoms: None  ADLScreening [facility]  Assessment Services) Patient's cognitive ability adequate to safely complete daily activities?: Yes Patient able to express need for assistance with ADLs?: Yes Independently performs ADLs?: Yes (appropriate for developmental age)  Prior Inpatient Therapy Prior Inpatient Therapy: No  Prior Outpatient Therapy Prior Outpatient Therapy: No Does patient have an ACCT team?: No Does patient have Intensive In-House Services?  : No Does patient have [facility] services? : No Does patient have P4CC services?: No  ADL Screening (condition at time of admission) Patient's cognitive ability adequate to safely complete daily activities?: Yes Is the patient deaf or have difficulty hearing?: No Does the patient have difficulty seeing, even when wearing glasses/contacts?: No Does the patient have difficulty concentrating, remembering, or making decisions?: No Patient able to express need for assistance with ADLs?: Yes Does the patient have difficulty dressing or bathing?: No Independently performs ADLs?: Yes (appropriate for developmental age) Does the patient have difficulty walking or climbing stairs?: No Weakness of Legs: None Weakness of Arms/Hands: None  Home Assistive Devices/Equipment Home Assistive Devices/Equipment: None  Therapy Consults (therapy consults require a physician order) PT Evaluation Needed: No OT Evalulation Needed: No SLP Evaluation Needed: No Abuse/Neglect Assessment (Assessment to be complete while patient is alone) Physical Abuse: Denies Verbal Abuse: Denies Sexual Abuse: Denies Exploitation of patient/patient's resources: Denies Self-Neglect: Denies Values / Beliefs Cultural Requests During Hospitalization: None Spiritual Requests During Hospitalization: None Consults Spiritual Care Consult Needed: No Social Work Consult Needed: No Merchant navy officer (For Healthcare) Does patient have an advance directive?: No Would patient like information on creating an  advanced directive?: No - patient declined information  Additional Information 1:1 In Past 12 Months?: No CIRT Risk: No Elopement Risk: No Does patient have medical clearance?: Yes     Disposition:  Disposition Initial Assessment Completed for this Encounter: Yes (consulted with Fransisca Kaufmann, NP) Disposition of Patient: Other dispositions (pt does not meet criteria for IP treatment. ) Other disposition(s): Information only (pt given resources for IP/OP alcohol rehab)  Laddie Aquas 02/04/2016 5:02 PM

## 2016-05-27 ENCOUNTER — Encounter (HOSPITAL_COMMUNITY): Payer: Self-pay

## 2016-07-06 DIAGNOSIS — J0101 Acute recurrent maxillary sinusitis: Secondary | ICD-10-CM | POA: Diagnosis not present

## 2016-07-10 DIAGNOSIS — E119 Type 2 diabetes mellitus without complications: Secondary | ICD-10-CM | POA: Diagnosis not present

## 2016-07-10 DIAGNOSIS — H31001 Unspecified chorioretinal scars, right eye: Secondary | ICD-10-CM | POA: Diagnosis not present

## 2016-07-18 DIAGNOSIS — E7800 Pure hypercholesterolemia, unspecified: Secondary | ICD-10-CM | POA: Diagnosis not present

## 2016-07-18 DIAGNOSIS — E119 Type 2 diabetes mellitus without complications: Secondary | ICD-10-CM | POA: Diagnosis not present

## 2016-07-18 DIAGNOSIS — I10 Essential (primary) hypertension: Secondary | ICD-10-CM | POA: Diagnosis not present

## 2016-07-27 DIAGNOSIS — E119 Type 2 diabetes mellitus without complications: Secondary | ICD-10-CM | POA: Diagnosis not present

## 2016-07-27 DIAGNOSIS — E7800 Pure hypercholesterolemia, unspecified: Secondary | ICD-10-CM | POA: Diagnosis not present

## 2017-01-16 DIAGNOSIS — E119 Type 2 diabetes mellitus without complications: Secondary | ICD-10-CM | POA: Diagnosis not present

## 2017-01-16 DIAGNOSIS — I10 Essential (primary) hypertension: Secondary | ICD-10-CM | POA: Diagnosis not present

## 2017-01-16 DIAGNOSIS — E7800 Pure hypercholesterolemia, unspecified: Secondary | ICD-10-CM | POA: Diagnosis not present

## 2017-01-19 DIAGNOSIS — E119 Type 2 diabetes mellitus without complications: Secondary | ICD-10-CM | POA: Diagnosis not present

## 2017-01-19 DIAGNOSIS — E7800 Pure hypercholesterolemia, unspecified: Secondary | ICD-10-CM | POA: Diagnosis not present

## 2017-02-12 DIAGNOSIS — K838 Other specified diseases of biliary tract: Secondary | ICD-10-CM | POA: Diagnosis not present

## 2017-02-12 DIAGNOSIS — I8390 Asymptomatic varicose veins of unspecified lower extremity: Secondary | ICD-10-CM | POA: Diagnosis not present

## 2017-02-12 DIAGNOSIS — K8020 Calculus of gallbladder without cholecystitis without obstruction: Secondary | ICD-10-CM | POA: Diagnosis not present

## 2017-02-19 DIAGNOSIS — K8013 Calculus of gallbladder with acute and chronic cholecystitis with obstruction: Secondary | ICD-10-CM | POA: Diagnosis not present

## 2017-02-23 DIAGNOSIS — I10 Essential (primary) hypertension: Secondary | ICD-10-CM | POA: Diagnosis not present

## 2017-02-23 DIAGNOSIS — Z79899 Other long term (current) drug therapy: Secondary | ICD-10-CM | POA: Diagnosis not present

## 2017-02-23 DIAGNOSIS — K8010 Calculus of gallbladder with chronic cholecystitis without obstruction: Secondary | ICD-10-CM | POA: Diagnosis not present

## 2017-02-25 DIAGNOSIS — K8010 Calculus of gallbladder with chronic cholecystitis without obstruction: Secondary | ICD-10-CM | POA: Diagnosis not present

## 2017-02-25 DIAGNOSIS — I10 Essential (primary) hypertension: Secondary | ICD-10-CM | POA: Diagnosis not present

## 2017-02-25 DIAGNOSIS — Z79899 Other long term (current) drug therapy: Secondary | ICD-10-CM | POA: Diagnosis not present

## 2017-04-02 DIAGNOSIS — Z23 Encounter for immunization: Secondary | ICD-10-CM | POA: Diagnosis not present

## 2017-05-13 ENCOUNTER — Encounter (HOSPITAL_COMMUNITY): Payer: Self-pay

## 2017-05-13 ENCOUNTER — Observation Stay (HOSPITAL_COMMUNITY)
Admission: EM | Admit: 2017-05-13 | Discharge: 2017-05-15 | Disposition: A | Discharge status: Home / Self Care | Payer: 59 | Attending: Internal Medicine | Admitting: Internal Medicine

## 2017-05-13 ENCOUNTER — Emergency Department (HOSPITAL_COMMUNITY): Payer: 59

## 2017-05-13 DIAGNOSIS — F10239 Alcohol dependence with withdrawal, unspecified: Principal | ICD-10-CM | POA: Insufficient documentation

## 2017-05-13 DIAGNOSIS — R404 Transient alteration of awareness: Secondary | ICD-10-CM | POA: Diagnosis not present

## 2017-05-13 DIAGNOSIS — R4020 Unspecified coma: Secondary | ICD-10-CM | POA: Diagnosis not present

## 2017-05-13 DIAGNOSIS — Z7289 Other problems related to lifestyle: Secondary | ICD-10-CM | POA: Diagnosis not present

## 2017-05-13 DIAGNOSIS — E119 Type 2 diabetes mellitus without complications: Secondary | ICD-10-CM | POA: Diagnosis not present

## 2017-05-13 DIAGNOSIS — Z79899 Other long term (current) drug therapy: Secondary | ICD-10-CM | POA: Insufficient documentation

## 2017-05-13 DIAGNOSIS — I10 Essential (primary) hypertension: Secondary | ICD-10-CM | POA: Diagnosis not present

## 2017-05-13 DIAGNOSIS — F329 Major depressive disorder, single episode, unspecified: Secondary | ICD-10-CM | POA: Insufficient documentation

## 2017-05-13 DIAGNOSIS — L409 Psoriasis, unspecified: Secondary | ICD-10-CM | POA: Insufficient documentation

## 2017-05-13 DIAGNOSIS — F10939 Alcohol use, unspecified with withdrawal, unspecified: Secondary | ICD-10-CM | POA: Diagnosis present

## 2017-05-13 DIAGNOSIS — Z9049 Acquired absence of other specified parts of digestive tract: Secondary | ICD-10-CM

## 2017-05-13 DIAGNOSIS — M6281 Muscle weakness (generalized): Secondary | ICD-10-CM | POA: Diagnosis not present

## 2017-05-13 DIAGNOSIS — E872 Acidosis: Secondary | ICD-10-CM | POA: Diagnosis not present

## 2017-05-13 DIAGNOSIS — Z9884 Bariatric surgery status: Secondary | ICD-10-CM | POA: Insufficient documentation

## 2017-05-13 DIAGNOSIS — R569 Unspecified convulsions: Secondary | ICD-10-CM | POA: Insufficient documentation

## 2017-05-13 DIAGNOSIS — Z7982 Long term (current) use of aspirin: Secondary | ICD-10-CM | POA: Diagnosis not present

## 2017-05-13 DIAGNOSIS — F10230 Alcohol dependence with withdrawal, uncomplicated: Secondary | ICD-10-CM

## 2017-05-13 DIAGNOSIS — N289 Disorder of kidney and ureter, unspecified: Secondary | ICD-10-CM | POA: Diagnosis not present

## 2017-05-13 DIAGNOSIS — F339 Major depressive disorder, recurrent, unspecified: Secondary | ICD-10-CM | POA: Diagnosis not present

## 2017-05-13 DIAGNOSIS — R740 Nonspecific elevation of levels of transaminase and lactic acid dehydrogenase [LDH]: Secondary | ICD-10-CM | POA: Diagnosis not present

## 2017-05-13 DIAGNOSIS — I872 Venous insufficiency (chronic) (peripheral): Secondary | ICD-10-CM | POA: Diagnosis not present

## 2017-05-13 DIAGNOSIS — R531 Weakness: Secondary | ICD-10-CM | POA: Diagnosis not present

## 2017-05-13 LAB — CBC WITH DIFFERENTIAL/PLATELET
Basophils Absolute: 0 10*3/uL (ref 0.0–0.1)
Basophils Relative: 0 %
Eosinophils Absolute: 0 10*3/uL (ref 0.0–0.7)
Eosinophils Relative: 0 %
HEMATOCRIT: 47.9 % (ref 39.0–52.0)
HEMOGLOBIN: 16.3 g/dL (ref 13.0–17.0)
LYMPHS ABS: 1.3 10*3/uL (ref 0.7–4.0)
Lymphocytes Relative: 13 %
MCH: 31.3 pg (ref 26.0–34.0)
MCHC: 34 g/dL (ref 30.0–36.0)
MCV: 92.1 fL (ref 78.0–100.0)
MONO ABS: 0.6 10*3/uL (ref 0.1–1.0)
MONOS PCT: 6 %
NEUTROS ABS: 8.2 10*3/uL — AB (ref 1.7–7.7)
NEUTROS PCT: 81 %
Platelets: 257 10*3/uL (ref 150–400)
RBC: 5.2 MIL/uL (ref 4.22–5.81)
RDW: 13.9 % (ref 11.5–15.5)
WBC: 10.1 10*3/uL (ref 4.0–10.5)

## 2017-05-13 LAB — URINALYSIS, ROUTINE W REFLEX MICROSCOPIC
Bacteria, UA: NONE SEEN
Bilirubin Urine: NEGATIVE
Glucose, UA: NEGATIVE mg/dL
KETONES UR: NEGATIVE mg/dL
Leukocytes, UA: NEGATIVE
Nitrite: NEGATIVE
PH: 5 (ref 5.0–8.0)
Protein, ur: 30 mg/dL — AB
Specific Gravity, Urine: 1.016 (ref 1.005–1.030)

## 2017-05-13 LAB — I-STAT CG4 LACTIC ACID, ED
LACTIC ACID, VENOUS: 8.53 mmol/L — AB (ref 0.5–1.9)
LACTIC ACID, VENOUS: 2.94 mmol/L — AB (ref 0.5–1.9)

## 2017-05-13 LAB — COMPREHENSIVE METABOLIC PANEL
ALK PHOS: 72 U/L (ref 38–126)
ALT: 27 U/L (ref 17–63)
ANION GAP: 17 — AB (ref 5–15)
AST: 54 U/L — ABNORMAL HIGH (ref 15–41)
Albumin: 3.6 g/dL (ref 3.5–5.0)
BILIRUBIN TOTAL: 0.4 mg/dL (ref 0.3–1.2)
BUN: 10 mg/dL (ref 6–20)
CO2: 17 mmol/L — ABNORMAL LOW (ref 22–32)
Calcium: 8.4 mg/dL — ABNORMAL LOW (ref 8.9–10.3)
Chloride: 102 mmol/L (ref 101–111)
Creatinine, Ser: 1.14 mg/dL (ref 0.61–1.24)
GLUCOSE: 174 mg/dL — AB (ref 65–99)
Potassium: 3.8 mmol/L (ref 3.5–5.1)
Sodium: 136 mmol/L (ref 135–145)
TOTAL PROTEIN: 6.7 g/dL (ref 6.5–8.1)

## 2017-05-13 LAB — RAPID URINE DRUG SCREEN, HOSP PERFORMED
AMPHETAMINES: NOT DETECTED
BARBITURATES: NOT DETECTED
Benzodiazepines: POSITIVE — AB
Cocaine: NOT DETECTED
Opiates: NOT DETECTED
Tetrahydrocannabinol: NOT DETECTED

## 2017-05-13 LAB — MAGNESIUM: Magnesium: 1.6 mg/dL — ABNORMAL LOW (ref 1.7–2.4)

## 2017-05-13 LAB — CK: CK TOTAL: 270 U/L (ref 49–397)

## 2017-05-13 LAB — PHOSPHORUS: Phosphorus: 3.3 mg/dL (ref 2.5–4.6)

## 2017-05-13 LAB — CBG MONITORING, ED: GLUCOSE-CAPILLARY: 127 mg/dL — AB (ref 65–99)

## 2017-05-13 LAB — LACTIC ACID, PLASMA
LACTIC ACID, VENOUS: 2.9 mmol/L — AB (ref 0.5–1.9)
LACTIC ACID, VENOUS: 1.9 mmol/L (ref 0.5–1.9)

## 2017-05-13 LAB — ETHANOL

## 2017-05-13 MED ORDER — SODIUM CHLORIDE 0.9 % IV BOLUS (SEPSIS)
1000.0000 mL | Freq: Once | INTRAVENOUS | Status: AC
  Administered 2017-05-13: 1000 mL via INTRAVENOUS

## 2017-05-13 MED ORDER — CHLORDIAZEPOXIDE HCL 25 MG PO CAPS
50.0000 mg | ORAL_CAPSULE | Freq: Four times a day (QID) | ORAL | Status: AC
  Administered 2017-05-13 – 2017-05-14 (×3): 50 mg via ORAL
  Filled 2017-05-13 (×3): qty 2

## 2017-05-13 MED ORDER — LORAZEPAM 2 MG/ML IJ SOLN: 0.0000 mg | Freq: Two times a day (BID) | INTRAMUSCULAR | Status: DC

## 2017-05-13 MED ORDER — VITAMIN B-1 100 MG PO TABS
100.0000 mg | ORAL_TABLET | Freq: Every day | ORAL | Status: DC
  Administered 2017-05-13: 100 mg via ORAL
  Filled 2017-05-13: qty 1

## 2017-05-13 MED ORDER — CHLORDIAZEPOXIDE HCL 25 MG PO CAPS: 50.0000 mg | ORAL_CAPSULE | Freq: Once | ORAL | Status: DC

## 2017-05-13 MED ORDER — FOLIC ACID 1 MG PO TABS
1.0000 mg | ORAL_TABLET | Freq: Every day | ORAL | Status: DC
  Administered 2017-05-13 – 2017-05-15 (×3): 1 mg via ORAL
  Filled 2017-05-13 (×3): qty 1

## 2017-05-13 MED ORDER — ACETAMINOPHEN 500 MG PO TABS
1000.0000 mg | ORAL_TABLET | Freq: Once | ORAL | Status: AC
  Administered 2017-05-13: 1000 mg via ORAL
  Filled 2017-05-13: qty 2

## 2017-05-13 MED ORDER — ACETAMINOPHEN 325 MG PO TABS: 650.0000 mg | ORAL_TABLET | Freq: Four times a day (QID) | ORAL | Status: DC | PRN

## 2017-05-13 MED ORDER — PROMETHAZINE HCL 25 MG PO TABS: 12.5000 mg | ORAL_TABLET | Freq: Four times a day (QID) | ORAL | Status: DC | PRN

## 2017-05-13 MED ORDER — ENOXAPARIN SODIUM 40 MG/0.4ML ~~LOC~~ SOLN
40.0000 mg | SUBCUTANEOUS | Status: DC
  Administered 2017-05-13 – 2017-05-14 (×2): 40 mg via SUBCUTANEOUS
  Filled 2017-05-13: qty 0.4

## 2017-05-13 MED ORDER — LORAZEPAM 2 MG/ML IJ SOLN
2.0000 mg | INTRAMUSCULAR | Status: DC | PRN
Start: 2017-05-13 — End: 2017-05-15

## 2017-05-13 MED ORDER — SODIUM CHLORIDE 0.9% FLUSH
3.0000 mL | Freq: Two times a day (BID) | INTRAVENOUS | Status: DC
  Administered 2017-05-13 – 2017-05-15 (×5): 3 mL via INTRAVENOUS

## 2017-05-13 MED ORDER — CHLORDIAZEPOXIDE HCL 25 MG PO CAPS
50.0000 mg | ORAL_CAPSULE | Freq: Three times a day (TID) | ORAL | Status: AC
  Administered 2017-05-14 (×3): 50 mg via ORAL
  Filled 2017-05-13 (×3): qty 2

## 2017-05-13 MED ORDER — LORAZEPAM 1 MG PO TABS
0.0000 mg | ORAL_TABLET | Freq: Two times a day (BID) | ORAL | Status: DC
Start: 2017-05-15 — End: 2017-05-14

## 2017-05-13 MED ORDER — ACETAMINOPHEN 650 MG RE SUPP: 650.0000 mg | Freq: Four times a day (QID) | RECTAL | Status: DC | PRN

## 2017-05-13 MED ORDER — RAMELTEON 8 MG PO TABS
8.0000 mg | ORAL_TABLET | Freq: Every evening | ORAL | Status: DC | PRN
  Administered 2017-05-13: 8 mg via ORAL
  Filled 2017-05-13: qty 1

## 2017-05-13 MED ORDER — ADULT MULTIVITAMIN W/MINERALS CH
1.0000 | ORAL_TABLET | Freq: Every day | ORAL | Status: DC
  Administered 2017-05-13 – 2017-05-15 (×3): 1 via ORAL
  Filled 2017-05-13 (×3): qty 1

## 2017-05-13 MED ORDER — MAGNESIUM SULFATE 2 GM/50ML IV SOLN
2.0000 g | Freq: Once | INTRAVENOUS | Status: AC
  Administered 2017-05-13: 2 g via INTRAVENOUS
  Filled 2017-05-13: qty 50

## 2017-05-13 MED ORDER — CHLORDIAZEPOXIDE HCL 25 MG PO CAPS
50.0000 mg | ORAL_CAPSULE | Freq: Two times a day (BID) | ORAL | Status: DC
  Administered 2017-05-15: 50 mg via ORAL
  Filled 2017-05-13: qty 2

## 2017-05-13 MED ORDER — LORAZEPAM 1 MG PO TABS: 0.0000 mg | ORAL_TABLET | Freq: Four times a day (QID) | ORAL | Status: DC

## 2017-05-13 MED ORDER — THIAMINE HCL 100 MG/ML IJ SOLN: 100.0000 mg | Freq: Every day | INTRAMUSCULAR | Status: DC

## 2017-05-13 MED ORDER — LORAZEPAM 2 MG/ML IJ SOLN
0.0000 mg | Freq: Four times a day (QID) | INTRAMUSCULAR | Status: DC
  Administered 2017-05-13: 2 mg via INTRAVENOUS
  Filled 2017-05-13: qty 1

## 2017-05-13 MED ORDER — ONDANSETRON HCL 4 MG/2ML IJ SOLN
4.0000 mg | Freq: Once | INTRAMUSCULAR | Status: AC
  Administered 2017-05-13: 4 mg via INTRAVENOUS
  Filled 2017-05-13: qty 2

## 2017-05-13 MED ORDER — SERTRALINE HCL 50 MG PO TABS
50.0000 mg | ORAL_TABLET | Freq: Every day | ORAL | Status: DC
  Administered 2017-05-14 – 2017-05-15 (×2): 50 mg via ORAL
  Filled 2017-05-13 (×3): qty 1

## 2017-05-13 NOTE — ED Notes (Signed)
Dr.Liu shown results of Lactic Acid.ED-LAb

## 2017-05-13 NOTE — ED Notes (Signed)
Pt ambulatory to restroom

## 2017-05-13 NOTE — ED Notes (Signed)
Lunch Tray Ordered @ 1231-per RN

## 2017-05-13 NOTE — ED Notes (Signed)
Meal tray delivered.

## 2017-05-13 NOTE — ED Notes (Signed)
Nikki K.RN and Dr.Glick informed of pt Lactic Acid results.

## 2017-05-13 NOTE — ED Triage Notes (Signed)
Pt drove to work with friend and upon arriving co-workers noticed that Pt had difficulty in using timclock. Pt then went to office and approx 1 hour later (0700) pt found to have shaking hands, lethargic and AAO x2.

## 2017-05-13 NOTE — ED Provider Notes (Signed)
[facility] EMERGENCY DEPARTMENT Provider Note   CSN: [id number] Arrival date & time: 05/13/17  0092     History   Chief Complaint Chief Complaint  Patient presents with  . Altered Mental Status    HPI ADRON GEISEL is a 56 y.o. male.  HPI  56 year old male who presents with altered mental status.  He has a history of alcohol abuse and hypertension.  He reports that he has been in his usual state of health.  He reports being at work, sitting in his office, and reports that the next thing he knew he was surrounded by people in ambulance.  According to EMS, his coworkers had found him slumped over at the table with some tremoring in his arms.  He initially was found confused and somnolent.  Patient denies any recent illnesses, fever, nausea, vomiting, cough, headache, focal numbness/weakness, chest pain, dyspnea, or near syncopal symptoms. No prior history of seizures. Reports weekly binge drinking, but does not drink every day and without withdrawal symptoms if he stops drinking.   Past Medical History:  Diagnosis Date  . Arthritis   . ETOH abuse   . Hypertension   . Psoriasis 10-21-12   elbows and knees    Patient Active Problem List   Diagnosis Date Noted  . Diverticulosis of colon without hemorrhage   . 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 Morbid obesity ([place]) 07/29/2012  . DM 07/01/2009  . PSORIASIS 07/01/2009  . HEMORRHOIDS, INTERNAL 06/28/2009  . SLEEP APNEA 06/28/2009    Past Surgical History:  Procedure Laterality Date  . BREATH TEK H PYLORI N/A 09/09/2012   Procedure: BREATH TEK H PYLORI;  Surgeon: Edward Jolly, MD;  Location: [facility];  Service: General;  Laterality: N/A;  . COLONOSCOPY N/A 12/20/2015   Procedure: COLONOSCOPY;  Surgeon: Daneil Dolin, MD;  [facility];  Service: Endoscopy;  Laterality: N/A;  9:45 Am  . ESOPHAGOGASTRODUODENOSCOPY N/A 10/25/2012   Procedure:  ESOPHAGOGASTRODUODENOSCOPY (EGD);  Surgeon: Edward Jolly, MD;  [facility];  Service: General;  Laterality: N/A;  . LAPAROSCOPIC GASTRIC SLEEVE RESECTION N/A 10/25/2012   Procedure: LAPAROSCOPIC GASTRIC SLEEVE RESECTION;  Surgeon: Edward Jolly, MD;  Location: [facility];  Service: General;  Laterality: N/A;  . MENISCUS REPAIR  03/2010       Home Medications    Prior to Admission medications   Medication Sig Start Date End Date Taking? Authorizing Provider  aspirin 81 MG tablet Take 81 mg by mouth daily.   Yes [provider]  lisinopril (PRINIVIL,ZESTRIL) 10 MG tablet Take 10 mg by mouth. 06/07/16  Yes [provider]  Multiple Vitamin (MULTIVITAMIN) tablet Take 1 tablet by mouth daily.   Yes [provider]  sertraline (ZOLOFT) 50 MG tablet Take 50 mg by mouth daily. 04/08/17  Yes [provider]  vitamin B-12 (CYANOCOBALAMIN) 100 MCG tablet Take 100 mcg by mouth daily.   Yes [provider]  LORazepam (ATIVAN) 1 MG tablet Take 1 tablet (1 mg total) by mouth every 8 (eight) hours as needed for anxiety. Patient not taking: Reported on 05/13/2017 02/04/16   Ripley Fraise, MD  Na Sulfate-K Sulfate-Mg Sulf (SUPREP BOWEL PREP KIT) 17.5-3.13-1.6 GM/180ML SOLN Take 1 kit by mouth as directed. Patient not taking: Reported on 02/04/2016 12/13/15   Rourk, [name], MD    Family History Family History  Problem Relation Age of Onset  . Dementia Mother   . Cancer Father  Lung  . Cancer Brother        Prostate  . Cancer Brother        Colon    Social History Social History   Tobacco Use  . Smoking status: Never Smoker  . Smokeless tobacco: Never Used  Substance Use Topics  . Alcohol use: Yes    Comment: daily   . Drug use: No     Allergies   Patient has no known allergies.   Review of Systems Review of Systems  Constitutional: Negative for fever.  Respiratory: Negative for choking and shortness of breath.     Cardiovascular: Negative for chest pain.  Gastrointestinal: Negative for abdominal pain, diarrhea, nausea and vomiting.  Genitourinary: Negative for difficulty urinating and dysuria.  Skin: Negative for wound.  Allergic/Immunologic: Negative for immunocompromised state.  Neurological: Negative for headaches.  Hematological: Does not bruise/bleed easily.  All other systems reviewed and are negative.    Physical Exam Updated Vital Signs BP (!) 141/89   Pulse 98   Temp 98 F (36.7 C) (Oral)   Resp 15   SpO2 95%   Physical Exam Physical Exam  Nursing note and vitals reviewed. Constitutional: Well developed, well nourished, non-toxic, and in no acute distress Head: Normocephalic and atraumatic.  Mouth/Throat: Oropharynx is clear and moist.  Neck: Normal range of motion. Neck supple.  Cardiovascular: Normal rate and regular rhythm.   Pulmonary/Chest: Effort normal and breath sounds normal.  Abdominal: Soft. There is no tenderness. There is no rebound and no guarding.  Musculoskeletal: Normal range of motion.  Skin: Skin is warm and dry.  Psychiatric: Cooperative  Neurological:  Alert, oriented to person, place, time, and situation. Memory grossly in tact. Fluent speech. No dysarthria or aphasia.  Cranial nerves: VF are full. Pupils are symmetric, and reactive to light. EOMI without nystagmus. No gaze deviation. Facial muscles symmetric with activation. Sensation to light touch over face in tact bilaterally. Hearing grossly in tact. Palate elevates symmetrically. Head turn and shoulder shrug are intact. Tongue midline.  Reflexes defered.  Muscle bulk and tone normal. No pronator drift. Moves all extremities symmetrically. Sensation to light touch is in tact throughout in bilateral upper and lower extremities. Coordination reveals no dysmetria with finger to nose.   ED Treatments / Results  Labs (all labs ordered are listed, but only abnormal results are displayed) Labs  Reviewed  CBC WITH DIFFERENTIAL/PLATELET - Abnormal; Notable for the following components:      Result Value   Neutro Abs 8.2 (*)    All other components within normal limits  COMPREHENSIVE METABOLIC PANEL - Abnormal; Notable for the following components:   CO2 17 (*)    Glucose, Bld 174 (*)    Calcium 8.4 (*)    AST 54 (*)    Anion gap 17 (*)    All other components within normal limits  URINALYSIS, ROUTINE W REFLEX MICROSCOPIC - Abnormal; Notable for the following components:   Hgb urine dipstick SMALL (*)    Protein, ur 30 (*)    Squamous Epithelial / LPF 0-5 (*)    All other components within normal limits  MAGNESIUM - Abnormal; Notable for the following components:   Magnesium 1.6 (*)    All other components within normal limits  I-STAT CG4 LACTIC ACID, ED - Abnormal; Notable for the following components:   Lactic Acid, Venous 8.53 (*)    All other components within normal limits  CBG MONITORING, ED - Abnormal; Notable for the following components:  Glucose-Capillary 127 (*)    All other components within normal limits  I-STAT CG4 LACTIC ACID, ED - Abnormal; Notable for the following components:   Lactic Acid, Venous 2.94 (*)    All other components within normal limits    EKG  EKG Interpretation  Date/Time:  Thursday May 13 2017 07:36:42 EST Ventricular Rate:  105 PR Interval:    QRS Duration: 98 QT Interval:  340 QTC Calculation: 450 R Axis:   68 Text Interpretation:  Sinus tachycardia Probable anteroseptal infarct, old No acute changes Confirmed by Brantley Stage [phone]) on 05/13/2017 8:21:01 AM       Radiology Ct Head Wo Contrast  Result Date: 05/13/2017 CLINICAL DATA:  Nontraumatic seizure, altered level of consciousness EXAM: CT HEAD WITHOUT CONTRAST TECHNIQUE: Contiguous axial images were obtained from the base of the skull through the vertex without intravenous contrast. COMPARISON:  None. FINDINGS: Brain: The ventricular system is somewhat prominent as  are the cortical sulci diffusely, consistent with atrophy. The septum is midline in position. The fourth ventricle and basilar cisterns are unremarkable. No hemorrhage, mass lesion, or acute infarction is seen. Vascular: No vascular abnormality is noted on this unenhanced study. Skull: On bone window images no acute calvarial abnormality is seen. Several lucencies are present which most likely are vascular in origin. Sinuses/Orbits: Mild mucosal thickening is present within the maxillary sinuses. No air-fluid level is seen. Other: None. IMPRESSION: 1. Diffuse atrophy.  No acute intracranial abnormality. 2. Several calvarial lucencies, most likely vascular in origin. Electronically Signed   By: Ivar Drape M.D.   On: 05/13/2017 08:29    Procedures Procedures (including critical care time)  Medications Ordered in ED Medications  LORazepam (ATIVAN) injection 0-4 mg (2 mg Intravenous Given 05/13/17 0955)    Or  LORazepam (ATIVAN) tablet 0-4 mg ( Oral See Alternative 05/13/17 0955)  LORazepam (ATIVAN) injection 0-4 mg (not administered)    Or  LORazepam (ATIVAN) tablet 0-4 mg (not administered)  thiamine (VITAMIN B-1) tablet 100 mg (100 mg Oral Given 05/13/17 0952)    Or  thiamine (B-1) injection 100 mg ( Intravenous See Alternative 05/13/17 0952)  sodium chloride 0.9 % bolus 1,000 mL (0 mLs Intravenous Stopped 05/13/17 0856)  magnesium sulfate IVPB 2 g 50 mL (2 g Intravenous New Bag/Given 05/13/17 0921)  sodium chloride 0.9 % bolus 1,000 mL (1,000 mLs Intravenous New Bag/Given 05/13/17 0954)  acetaminophen (TYLENOL) tablet 1,000 mg (1,000 mg Oral Given 05/13/17 0952)  ondansetron (ZOFRAN) injection 4 mg (4 mg Intravenous Given 05/13/17 0954)     Initial Impression / Assessment and Plan / ED Course  I have reviewed the triage vital signs and the nursing notes.  Pertinent labs & imaging results that were available during my care of the patient were reviewed by me and considered in my medical  decision making (see chart for details).     56 year old male who presents with altered mental status.  His presentation is most consistent with seizure.  Does have tongue biting but no urinary incontinence.  Noted to be very somnolent and confused per EMS but once in the ED is alert, oriented, without focal neurological deficits.  Additionally did not admit to significant alcohol abuse aside from binge drinking a few days during the week, but when his wife arrived, she reports that he has been a daily drinker multiple beers, and has a history of alcohol dependence.  Most likely alcohol withdrawal seizure.  Initial see was score of 12 and given 2 of  Ativan.  Blood work also notable for lactic acid of 8, which is due to his seizure.  This is clearing with IV fluids.  I do not think this is sepsis.  There is mild hypomagnesemia, and given repletion.  No other major electrolyte or metabolic derangements.  Discussed with internal medicine teaching service who will admit for alcohol withdrawal management.  Final Clinical Impressions(s) / ED Diagnoses   Final diagnoses:  Alcohol withdrawal seizure without complication [facility])    ED Discharge Orders    None       Forde Dandy, MD 05/13/17 1057

## 2017-05-13 NOTE — ED Notes (Signed)
Pt transferred to hospital bed for comfort.

## 2017-05-13 NOTE — Clinical Social Work Note (Signed)
Clinical Social Work Assessment  Patient Details  Name: Albert Tanner MRN: [id number] Date of Birth: Aug 09, 1960  Date of referral:  05/13/17               Reason for consult:  Substance Use/ETOH Abuse                Permission sought to share information with:    Permission granted to share information::     Name::        Agency::     Relationship::     Contact Information:     Housing/Transportation Living arrangements for the past 2 months:  Single Family Home Source of Information:  Patient Patient Interpreter Needed:  None Criminal Activity/Legal Involvement Pertinent to Current Situation/Hospitalization:    Significant Relationships:  Spouse Lives with:  Spouse, Relatives(Adult Grandchild) Do you feel safe going back to the place where you live?  Yes Need for family participation in patient care:  Yes (Comment)  Care giving concerns:  Pt expressed concerns about alcoholism.  Social Worker assessment / plan:  CSW consulted for alcoholism. CSW met with pt via bedside. Pt and CSW discussed pt's alcohol use. Pt informed CSW that he has quit drinking in the past. Pt stated his most recent sobriety occurred about a year ago. Pt said he was alcohol free for 2 to 3 years ago. Pt informed CSW that alcoholism runs in his family, specifically on his mother's side. Pt stated his brother and multiple uncles were alcoholics. Pt stated that some days he does not drink at all, but then when he is off of work he drinks between 7 and 8 beers. Pt stated that during the snow days this week he drank 12 or more beers each day. Pt states that he does not drink before work and only drinks beers, no whiskey anymore. Pt did not participate in SBIRT.   Pt accepted resources for AA. Pt and CSW discussed meetings and pt's plan to attend meetings. Pt and CSW reviewed AA schedule and determined which meetings he could attend. Pt informed CSW that he has a sponsor. CSW encouraged pt to reach out to his sponsor  today. Pt stated he would speak with his sponsor and discuss attending a meeting with him.   Employment status:  [employer] information:  Other (Comment Required)([employer]) PT Recommendations:  Not assessed at this time Information / Referral to community resources:  Outpatient Substance Abuse Treatment Options  Patient/Family's Response to care:  Pt agreeable to plan of care.   Patient/Family's Understanding of and Emotional Response to Diagnosis, Current Treatment, and Prognosis:  Pt did not express any concerns or questions to CSW at this time.   Emotional Assessment Appearance:  Appears stated age Attitude/Demeanor/Rapport:    Affect (typically observed):  Accepting, Appropriate, Pleasant Orientation:  Oriented to Self, Oriented to Situation, Oriented to Place, Oriented to  Time Alcohol / Substance use:  Alcohol Use Psych involvement (Current and /or in the community):  No (Comment)  Discharge Needs  Concerns to be addressed:  Substance Abuse Concerns(Alcohol ) Readmission within the last 30 days:  No Current discharge risk:  Substance Abuse Barriers to Discharge:  No Barriers Identified   Wendelyn Breslow, LCSW 05/13/2017, 5:27 PM

## 2017-05-13 NOTE — H&P (Signed)
Date: 05/13/2017               Patient Name:  Albert CoolerMichael R Nabers MRN: [id number]  DOB: 10/03/1960 Age / Sex: 56 y.o., male   PCP: Estanislado PandySasser, Paul W, MD         Medical Service: Internal Medicine Teaching Service         Attending Physician: Dr. Inez CatalinaMullen, Emily B, MD    First Contact: Dr. Crista ElliotHarbrecht  Pager: [phone]  Second Contact: Dr. 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   Patient is accompanied by his wife. Wife says that patient woke up this morning, drove to work, and left the house at 5 AM. Then at the office, his co-workers noted that he was slumped over his chair in his office, and patient remembers the next thing that he was surrounded by people in ambulance. [place]. Pt denies any nausea, vomiting, but did bite his tongue during the seizure episode. Denies any bladder or bowel incontinence before, during or after the episode.   Patient was hesitant about answering questions about his use of alcohol- but wife stated that patient took 12 beers yesterday, and his last drink was yesterday evening. Beers are 12 oz in size. Pt did not eat dinner yesterday and went to bed.   Normally, pt drinks 5-6 days per week, and several beers in a day. He has been drinking since age of 56. 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 He describes himself as a 'functioning alcoholic'.  He denies any prior seizures, or admissions for alcohol  withdrawal. Denies SI/HI.  On my exam, patient was A&O x 3 and answering all questions appropriately. He was slightly tachycardic, and had some tremors on the right outstretched hand. Otherwise exam was unremarkable. CT head was negative. He denies any fevers, chills, n/v/d/c/dyspnea/chestpain, and had some headache that went away.  FH: father had lung cancer at 2280, brother colon cancer at 2760, and MI In 5350s SH: alcohol use disorder, no smoking or illicit drug use, works and is employed    Meds:  Current Meds  Medication Sig  . aspirin 81 MG tablet Take 81 mg by mouth daily.  Marland Kitchen. lisinopril (PRINIVIL,ZESTRIL) 10 MG tablet Take 10 mg by mouth.  . Multiple Vitamin (MULTIVITAMIN) tablet Take 1 tablet by mouth daily.  . sertraline (ZOLOFT) 50 MG tablet Take 50 mg by mouth daily.  . vitamin B-12 (CYANOCOBALAMIN) 100 MCG tablet Take 100 mcg by mouth daily.     Allergies: Allergies as of 05/13/2017  . (No Known Allergies)   Past Medical History:  Diagnosis Date  . Arthritis   . ETOH abuse   . Hypertension   . Psoriasis 10-21-12   elbows and knees      Review of Systems: A complete ROS was negative except as per HPI.   Physical Exam: Blood pressure Marland Kitchen(!)  141/89, pulse 98, temperature 98 F (36.7 C), temperature source Oral, resp. rate 15, SpO2 95 %.  General:  A&O, in NAD.  HENT: NCAT MMM,          Tongue had some bites on the left side  Eyes: PERRLA, no nystagmus  CV: Regular rate, regular rhythm, no murmurs or rubs appreciated. Resp: equal and symmetric breath sounds, no wheezing or crackles. Abdomen: soft, nontender, nondistended, +BS in all 4 quadrants Skin: warm, dry, intact, no open lesions or atypical rash noted Extremities: pulses intact b/l, no edema  Neurologic exam: MS: A&O to person, time, place CN II-XII grossly intact Reflex: 2+ symmetric patellar, unable to get babinski  Motor: 5/5 strength in upper, 5/5 in lower extremities, normal muscle tone Cerebellar:  normal RAM,  Slight right hand tremor on outstretched hand    EKG: personally reviewed my interpretation is sinus tachycardia, normal qtc of 450  CXR: personally reviewed my interpretation is not obtained   Assessment & Plan by Problem: Active Problems:   Alcohol withdrawal (HCC)   Alcohol withdrawal: Patient with extensive alcohol use disorder history presents with a witnessed alcohol seizure this morning, and now he is back to baseline and a&o 3 x. He took 12 beers yesterday. He did have some biting of his tongue on the left side. CT head was negative for acute injury. Presented with lactic acidosis of 8.5--> 2.9 and repeat one still 2.9. Received 1 L bolus and giving another liter  -monitor in step down on tele -IV fluids for lactic acidosis -seizure precautions -CIWA protocol with PRN ativan per protocol - librium taper started -check CK, Phos -check UDS -thiamine, folic acid, MVM -repeat CBC, CMET, Mg tomorrow -consult to social work for [employer]   History of gastric bypass surgery: He went from 318 lbs to 245 lbs.  -checking vitamin B12, folate, for micronutrient deficiencies   Lactic acidosis:  Most likely etiology is alcohol withdrawal seizure. His LFTs are in normal range (slight elevation of AST). Is not a diabetic. Does not look septic  -trend lactic acid 8.9--> 2.9--> 2.9 -IV fluids  -checking UDS   Depression -continuing zoloft  F NS bolus  E N  DVT ppx: lovenox  Code- full    Dispo: Admit patient to Observation with expected length of stay less than 2 midnights.  Signed: Deneise LeverSaraiya, Bristyl Mclees, MD 05/13/2017, 11:50 AM

## 2017-05-13 NOTE — ED Notes (Signed)
Pt continuously calling out stating the telemetry monitor is beeping, RN and EMTs checking monitor but it says "check equipment". All leads and stickers have been replaced but monitor still beeping and pt getting frustrated, threatening to go home if beeping does not stop. IT notified and en route to troubleshoot.

## 2017-05-13 NOTE — ED Notes (Signed)
Pt called out and ask about his pocket knife. Pt states he had his pocket knife while in the ambulance but cannot find it while here in the ED. This RN looked through all pts belongings and did not find a pocket knife. The only pt belongings the pt has with him is jeans, t shirt, button down shirt, hat, boots and keys. Pts wife took home the pts wallet. This RN also asked security if pocket knife was is the safe but it was not. Pt states he will call his boss in the am to make sure he didn't leave it at work. (Which is where EMS picked him up from)

## 2017-05-14 ENCOUNTER — Observation Stay (HOSPITAL_BASED_OUTPATIENT_CLINIC_OR_DEPARTMENT_OTHER): Payer: 59

## 2017-05-14 ENCOUNTER — Observation Stay (HOSPITAL_COMMUNITY): Payer: 59

## 2017-05-14 DIAGNOSIS — R55 Syncope and collapse: Secondary | ICD-10-CM | POA: Diagnosis not present

## 2017-05-14 DIAGNOSIS — R569 Unspecified convulsions: Secondary | ICD-10-CM

## 2017-05-14 DIAGNOSIS — E872 Acidosis: Secondary | ICD-10-CM | POA: Diagnosis not present

## 2017-05-14 DIAGNOSIS — I5030 Unspecified diastolic (congestive) heart failure: Secondary | ICD-10-CM | POA: Diagnosis not present

## 2017-05-14 DIAGNOSIS — F10230 Alcohol dependence with withdrawal, uncomplicated: Secondary | ICD-10-CM

## 2017-05-14 DIAGNOSIS — I10 Essential (primary) hypertension: Secondary | ICD-10-CM | POA: Diagnosis not present

## 2017-05-14 LAB — COMPREHENSIVE METABOLIC PANEL
ALBUMIN: 3 g/dL — AB (ref 3.5–5.0)
ALT: 23 U/L (ref 17–63)
AST: 46 U/L — AB (ref 15–41)
Alkaline Phosphatase: 63 U/L (ref 38–126)
Anion gap: 6 (ref 5–15)
BUN: 9 mg/dL (ref 6–20)
CHLORIDE: 107 mmol/L (ref 101–111)
CO2: 24 mmol/L (ref 22–32)
CREATININE: 0.88 mg/dL (ref 0.61–1.24)
Calcium: 8 mg/dL — ABNORMAL LOW (ref 8.9–10.3)
GFR calc Af Amer: >60 mL/min (ref >60)
GFR calc non Af Amer: >60 mL/min (ref >60)
GLUCOSE: 97 mg/dL (ref 65–99)
POTASSIUM: 3.9 mmol/L (ref 3.5–5.1)
SODIUM: 137 mmol/L (ref 135–145)
Total Bilirubin: 0.7 mg/dL (ref 0.3–1.2)
Total Protein: 5.5 g/dL — ABNORMAL LOW (ref 6.5–8.1)

## 2017-05-14 LAB — GLUCOSE, CAPILLARY
GLUCOSE-CAPILLARY: 87 mg/dL (ref 65–99)
GLUCOSE-CAPILLARY: 131 mg/dL — AB (ref 65–99)
GLUCOSE-CAPILLARY: 125 mg/dL — AB (ref 65–99)

## 2017-05-14 LAB — HIV ANTIBODY (ROUTINE TESTING W REFLEX): HIV SCREEN 4TH GENERATION: NONREACTIVE

## 2017-05-14 LAB — MAGNESIUM: Magnesium: 2.1 mg/dL (ref 1.7–2.4)

## 2017-05-14 LAB — CBC
HCT: 42.7 % (ref 39.0–52.0)
Hemoglobin: 14.1 g/dL (ref 13.0–17.0)
MCH: 30.7 pg (ref 26.0–34.0)
MCHC: 33 g/dL (ref 30.0–36.0)
MCV: 93 fL (ref 78.0–100.0)
PLATELETS: 204 10*3/uL (ref 150–400)
RBC: 4.59 MIL/uL (ref 4.22–5.81)
RDW: 14.3 % (ref 11.5–15.5)
WBC: 7.4 10*3/uL (ref 4.0–10.5)

## 2017-05-14 LAB — MRSA PCR SCREENING: MRSA by PCR: NEGATIVE

## 2017-05-14 LAB — ECHOCARDIOGRAM COMPLETE

## 2017-05-14 LAB — VITAMIN B12: Vitamin B-12: 613 pg/mL (ref 180–914)

## 2017-05-14 MED ORDER — PERFLUTREN LIPID MICROSPHERE
1.0000 mL | INTRAVENOUS | Status: AC | PRN
  Administered 2017-05-14: 2 mL via INTRAVENOUS

## 2017-05-14 NOTE — Procedures (Signed)
History: 56 year old male being evaluated for seizures  Sedation: Chlordiazepoxide  Technique: This is a 21 channel routine scalp EEG performed at the bedside with bipolar and monopolar montages arranged in accordance to the international 10/20 system of electrode placement. One channel was dedicated to EKG recording.    Background: The background consists of intermixed alpha and beta activities.  There is excessive beta activity.  There is a well defined posterior dominant rhythm of 9 - 10 Hz that attenuates with eye opening. Sleep is recorded with normal appearing structures.   Photic stimulation: Physiologic driving is not performed  EEG Abnormalities: None  Clinical Interpretation: This normal EEG is recorded in the waking and sleep state.  The excessive beta activity is likely due to benzodiazepine effect.  There was no seizure or seizure predisposition recorded on this study. Please note that a normal EEG does not preclude the possibility of epilepsy.   Ritta SlotMcNeill Latoya Maulding, MD [facility] [phone]  If 7pm- 7am, please page neurology on call as listed in AMION.

## 2017-05-14 NOTE — Evaluation (Signed)
Physical Therapy One Time Evaluation Patient Details Name: Albert Tanner MRN: [id number] DOB: 10/01/1960 Today's Date: 05/14/2017   History of Present Illness  This is a 56 year old patient with history of alcohol use disorder, prior suicidal thoughts in 2017, s/p lap chole in 2018, s/p gastric bypass surgery 5-6 years ago presents with a seizure like activity this morning. Patient is accompanied by his wife. Wife says that patient woke up this morning, drove to work, and left the house at 5 AM. Then at the office, his co-workers noted that he was slumped over his chair in his office, and patient remembers the next thing that he was surrounded by people in ambulance. [place] [employer] [place]. Pt denies any nausea, vomiting, but did bite his tongue during the seizure episode. Patient was hesitant about answering questions about his use of alcohol- but wife stated that patient took 12 beers yesterday, and his last drink was yesterday evening. Beers are 12 oz in size. Pt did not eat dinner yesterday and went to bed.   Clinical Impression  Pt admitted with above diagnosis. Pt currently without significant functional limitations. Pt was able to ambulate on unit without LOB and without A device.  No mobility issues.   Pt will not need skilled PT at this time.  Will sign off.        Follow Up Recommendations No PT follow up    Equipment Recommendations  None recommended by PT    Recommendations for Other Services       Precautions / Restrictions Precautions Precautions: Fall Restrictions Weight Bearing Restrictions: No      Mobility  Bed Mobility Overal bed mobility: Independent                Transfers Overall transfer level: Independent                  Ambulation/Gait Ambulation/Gait assistance: Independent Ambulation Distance (Feet): 700 Feet Assistive device: None Gait Pattern/deviations: WFL(Within Functional Limits)   Gait  velocity interpretation: at or above normal speed for age/gender    Stairs            Wheelchair Mobility    Modified Rankin (Stroke Patients Only)       Balance Overall balance assessment: Independent                               Standardized Balance Assessment Standardized Balance Assessment : Dynamic Gait Index   Dynamic Gait Index Level Surface: Normal Change in Gait Speed: Normal Gait with Horizontal Head Turns: Normal Gait with Vertical Head Turns: Normal Gait and Pivot Turn: Normal Step Over Obstacle: Normal Step Around Obstacles: Normal Steps: Mild Impairment Total Score: 23       Pertinent Vitals/Pain Pain Assessment: No/denies pain  VSS  Home Living Family/patient expects to be discharged to:: Private residence Living Arrangements: Spouse/significant other Available Help at Discharge: Family;Available 24 hours/day(Pt still works) Type of Home: House Home Access: Ramped entrance     Home Layout: One level Home Equipment: Cane - single point;Bedside commode      Prior Function Level of Independence: Independent               Hand Dominance        Extremity/Trunk Assessment   Upper Extremity Assessment Upper Extremity Assessment: Defer to OT evaluation    Lower Extremity Assessment Lower Extremity Assessment: Overall [facility]  for tasks assessed    Cervical / Trunk Assessment Cervical / Trunk Assessment: Normal  Communication   Communication: No difficulties  Cognition Arousal/Alertness: Awake/alert Behavior During Therapy: WFL for tasks assessed/performed Overall Cognitive Status: Within Functional Limits for tasks assessed                                        General Comments General comments (skin integrity, edema, etc.): 23/24 on DGI suggesting low risk of falls.     Exercises General Exercises - Lower Extremity Long Arc Quad: AROM;Both;10 reps;Seated Hip Flexion/Marching: AROM;Both;10  reps;Seated   Assessment/Plan    PT Assessment Patent does not need any further PT services  PT Problem List         PT Treatment Interventions      PT Goals (Current goals can be found in the Care Plan section)  Acute Rehab PT Goals Patient Stated Goal: N/A PT Goal Formulation: All assessment and education complete, DC therapy    Frequency     Barriers to discharge        Co-evaluation               AM-PAC PT "6 Clicks" Daily Activity  Outcome Measure Difficulty turning over in bed (including adjusting bedclothes, sheets and blankets)?: None Difficulty moving from lying on back to sitting on the side of the bed? : None Difficulty sitting down on and standing up from a chair with arms (e.g., wheelchair, bedside commode, etc,.)?: None Help needed moving to and from a bed to chair (including a wheelchair)?: None Help needed walking in hospital room?: None Help needed climbing 3-5 steps with a railing? : None 6 Click Score: 24    End of Session Equipment Utilized During Treatment: Gait belt Activity Tolerance: Patient tolerated treatment well Patient left: in chair;with call bell/phone within reach;with chair alarm set Nurse Communication: Mobility status PT Visit Diagnosis: Muscle weakness (generalized) (M62.81)    Time: [id number] PT Time Calculation (min) (ACUTE ONLY): 26 min   Charges:   PT Evaluation $PT Eval Low Complexity: 1 Low PT Treatments $Gait Training: 8-22 mins   PT G Codes:   PT G-Codes **NOT FOR INPATIENT CLASS** Functional Assessment Tool Used: AM-PAC 6 Clicks Basic Mobility Functional Limitation: Mobility: Walking and moving around Mobility: Walking and Moving Around Current Status (V4098(G8978): 0 percent impaired, limited or restricted Mobility: Walking and Moving Around Goal Status (J1914(G8979): 0 percent impaired, limited or restricted Mobility: Walking and Moving Around Discharge Status (N8295(G8980): 0 percent impaired, limited or restricted    [facility]CDawn  Sebastian Lurz,PT Acute Rehabilitation [phone] (pager)   Albert Tanner 05/14/2017, 11:15 AM

## 2017-05-14 NOTE — Plan of Care (Signed)
Patient was oriented to unit. He was educated on his current care plan and demonstrated understanding.

## 2017-05-14 NOTE — Care Management Note (Signed)
Case Management Note  Patient Details  Name: Albert CoolerMichael R Tanner MRN: [id number] Date of Birth: 01/28/1961  Subjective/Objective:    From home with wife, pta indep,  presents with Alcohol withdrawal seizure like activity. He has PCP and medication coverage.                   Action/Plan: NCM will follow for dc needs.   Expected Discharge Date:                  Expected Discharge Plan:  Home/Self Care  In-House Referral:     Discharge planning Services  CM Consult  Post Acute Care Choice:    Choice offered to:     DME Arranged:    DME Agency:     HH Arranged:    HH Agency:     Status of Service:  In process, will continue to follow  If discussed at Long Length of Stay Meetings, dates discussed:    Additional Comments:  Leone Havenaylor, Tahsin Benyo Clinton, RN 05/14/2017, 12:35 PM

## 2017-05-14 NOTE — ED Notes (Signed)
Pt refusing BP

## 2017-05-14 NOTE — Progress Notes (Signed)
EEG completed, results pending. 

## 2017-05-14 NOTE — Progress Notes (Signed)
Patient requested visit.  We had short visit. Will come later when patient is alone. Phebe CollaDonna S Mikaia Janvier, Chaplain   05/14/17 1100  Clinical Encounter Type  Visited With Patient;Patient and family together  Visit Type Initial;Other (Comment)  Referral From Patient  Consult/Referral To Chaplain  Spiritual Encounters  Spiritual Needs Prayer  Stress Factors  Patient Stress Factors None identified  Family Stress Factors None identified

## 2017-05-14 NOTE — Progress Notes (Signed)
  Echocardiogram 2D Echocardiogram has been performed.  Celene SkeenVijay  Kalina Morabito 05/14/2017, 3:30 PM

## 2017-05-14 NOTE — Progress Notes (Signed)
   Subjective: The patient was sitting in his chair today upon entering the room. He was upset that he was not given a diet but was amicable. He stated that he understood he would need to complete additional evaluation prior to discharge to which he agreed.  Objective:  Vital signs in last 24 hours: Vitals:   05/14/17 0825 05/14/17 0835 05/14/17 0900 05/14/17 1201  BP:  117/88 (!) 128/97 121/82  Pulse:  77 81 83  Resp:  16 17 15   Temp:  98.1 F (36.7 C)  98 F (36.7 C)  TempSrc: Oral Oral  Oral  SpO2:  98% 98% 96%   ROS negative except as per HPI.  Physical Exam  Constitutional: He appears well-developed and well-nourished. No distress.  Cardiovascular: Normal rate and regular rhythm.  No murmur heard. Pulmonary/Chest: Effort normal and breath sounds normal.  Abdominal: Soft. Bowel sounds are normal. He exhibits no distension.  Musculoskeletal: He exhibits no edema or tenderness.  Neurological: He is alert.  Strength is 5/5 upper and lower bilaterally. CN 2-12 grossly intact  Vitals reviewed.  Assessment/Plan:  Active Problems:   Alcohol withdrawal (HCC)  Seizure vs. Syncope It is unclear if the shaking started before or after his LOC initially but he stated today that he felt "odd", cool and rather clammy with some mild tremor prior to the LOC.  This could certainly be a seizure, but history is not completely consistent with ETOH withdrawal.  DDx could be hypoglycemia (very little oral intake beyond beer), vs new onset adult epilepsy vs ETOH withdrawal vs cardiogenic syncope vs vasovagal - Would monitor on telemetry - Check TTE EF 55-60% with normal wall motion Possible grade 2 diastolic dysfunction. - Librium taper started by resident team, may need to be continued on discharge if he refuses to stay. - CIWA protocol w/ librium taper - Discuss ETOH cessation with patient again - He has no focal deficits, so MRI can likely be held unless anything concerning on EEG  AGMA  with elevated lactate - Admitted with low bicarb and AG acidosis, likely from acute event - Lactic acid improved with fluids to below 2 - Trend AG and bicarb  HTN - Hold home lisinopril in setting of mild renal insufficiency  Renal insufficiency - Cr above baseline, trend after fluids  Dispo: Anticipated discharge in approximately 1-2 day(s).   Lanelle BalHarbrecht, Anni Hocevar, MD 05/14/2017, 3:36 PM Pager# [phone]

## 2017-05-15 DIAGNOSIS — E872 Acidosis: Secondary | ICD-10-CM | POA: Diagnosis not present

## 2017-05-15 DIAGNOSIS — I10 Essential (primary) hypertension: Secondary | ICD-10-CM | POA: Diagnosis not present

## 2017-05-15 DIAGNOSIS — Z7289 Other problems related to lifestyle: Secondary | ICD-10-CM

## 2017-05-15 DIAGNOSIS — N289 Disorder of kidney and ureter, unspecified: Secondary | ICD-10-CM | POA: Diagnosis not present

## 2017-05-15 DIAGNOSIS — R55 Syncope and collapse: Secondary | ICD-10-CM | POA: Diagnosis not present

## 2017-05-15 DIAGNOSIS — R740 Nonspecific elevation of levels of transaminase and lactic acid dehydrogenase [LDH]: Secondary | ICD-10-CM

## 2017-05-15 LAB — GLUCOSE, CAPILLARY
GLUCOSE-CAPILLARY: 142 mg/dL — AB (ref 65–99)
GLUCOSE-CAPILLARY: 117 mg/dL — AB (ref 65–99)

## 2017-05-15 LAB — CK: Total CK: 330 U/L (ref 49–397)

## 2017-05-15 MED ORDER — CHLORDIAZEPOXIDE HCL 25 MG PO CAPS: 50.0000 mg | ORAL_CAPSULE | Freq: Every day | ORAL | 0 refills | Status: DC

## 2017-05-15 MED ORDER — NALTREXONE HCL 50 MG PO TABS: 50.0000 mg | ORAL_TABLET | Freq: Every day | ORAL | 0 refills | Status: DC

## 2017-05-15 NOTE — Progress Notes (Signed)
   Subjective: Patient was evaluated this morning.  He states he feels fine.  He denies chest pain, shortness of breath, dizziness, N/V.  He states he wants to go home today.  He also states he is not going to drink alcohol after this admission.  Objective:  Vital signs in last 24 hours: Vitals:   05/15/17 0100 05/15/17 0334 05/15/17 0800 05/15/17 0900  BP: 132/89 (!) 124/99    Pulse:  83 80 84  Resp:  (!) 22 18 17   Temp: 97.6 F (36.4 C) (!) 97.5 F (36.4 C)    TempSrc: Oral Oral    SpO2:  99% 96% 95%   ROS negative except as per HPI.  Physical Exam  Constitutional: He appears well-developed and well-nourished. No distress.  Cardiovascular: Normal rate and regular rhythm.  No murmur heard. Pulmonary/Chest: Effort normal and breath sounds normal.  Abdominal: Soft. Bowel sounds are normal. He exhibits no distension.  Musculoskeletal: He exhibits no edema or tenderness.  Neurological: He is alert.  Strength is 5/5 upper and lower bilaterally. CN 2-12 grossly intact  Vitals reviewed.  Assessment/Plan:  Active Problems:   Alcohol withdrawal (HCC)  Seizure vs. Syncope Likely alcohol withdrawal seizure.  Patient had an ECHO showing LV EF 55-60% with normal wall motion and grade 2 diastolic dysfunction with no valvular disease. EEG normal.  Librium taper was started on admission and will continue at discharge.  Advised not to combine with alcohol and patient expressed understanding.  Discussed naltrexone with patient and patient was interested in starting medication on discharge. - CIWA protocol w/ librium taper, finishing on discharge (2 pills) - Discussed ETOH cessation with patient   AGMA with elevated lactate Resolved  HTN Held initially due to AKI.  Currently normotensive. -restarting lisinopril at discharge  Renal insufficiency Resolved  Dispo: Anticipated discharge today.   Geralyn CorwinHoffman, Jessica PeeblesRatliff, DO 05/15/2017, 9:15 AM Pager# [phone]

## 2017-05-15 NOTE — Discharge Summary (Signed)
Name: Albert Tanner MRN: [id number] DOB: Dec 26, 1960 56 y.o. PCP: Manon Hilding, MD  Date of Admission: 05/13/2017  7:31 AM Date of Discharge: 05/15/2017 Attending Physician: Sid Falcon, MD  Discharge Diagnosis: 1. Loss of consciousness   Active Problems:   Alcohol withdrawal ([place])   Discharge Medications: Allergies as of 05/15/2017   No Known Allergies     Medication List    STOP taking these medications   LORazepam 1 MG tablet Commonly known as:  ATIVAN   Na Sulfate-K Sulfate-Mg Sulf 17.5-3.13-1.6 GM/177ML Soln Commonly known as:  SUPREP BOWEL PREP KIT     TAKE these medications   aspirin 81 MG tablet Take 81 mg by mouth daily.   chlordiazePOXIDE 25 MG capsule Commonly known as:  LIBRIUM Take 2 capsules (50 mg total) by mouth daily.   lisinopril 10 MG tablet Commonly known as:  PRINIVIL,ZESTRIL Take 10 mg by mouth.   multivitamin tablet Take 1 tablet by mouth daily.   naltrexone 50 MG tablet Commonly known as:  DEPADE Take 1 tablet (50 mg total) by mouth daily.   sertraline 50 MG tablet Commonly known as:  ZOLOFT Take 50 mg by mouth daily.   vitamin B-12 100 MCG tablet Commonly known as:  CYANOCOBALAMIN Take 100 mcg by mouth daily.       Disposition and follow-up:   Albert Tanner was discharged from [facility] in good condition.  At the hospital follow up visit please address:  1.  Alcohol use, if patient started naltrexone, any more episodes with loss of consciousness  2.  Labs / imaging needed at time of follow-up: none  3.  Pending labs/ test needing follow-up: none  Follow-up Appointments: Follow-up Information    Sasser, Silvestre Moment, MD Follow up.   Specialty:  Family Medicine Contact information: [address] [address] [place] Hospital Course by problem list: Active Problems:   Alcohol withdrawal ([place])   1.  Loss of Consciousness Patient presented to the ED by  ambulance from his work after he was found slumped over his chair. Per report patient was witnessed to be shaking. However, it is unclear if the shaking started before or after his loss of consciousness. Patient has a heavy alcohol drinking history. Per wife he had 12 drinks prior to day of admission. His alcohol level was 0 on admission day. Patient states that at work he did not feel well and woke up confused in an ambulance. His neuro exam was intact however he did have biting of his tongue during his loss of consciousness episode. Workup inpatient included echocardiogram and EEG. Echocardiogram showed LVEF of 55-60 percent, grade 2 diastolic dysfunction with no valvular disease. EEG was read as normal. It is most likely the patient had an alcohol withdrawal seizure. He was started on a Librium taper on admission and this was continued at discharge. Patient states he does not want to drink alcohol after admission and will attend AA meetings for which he has attended in the past.  Offered naltrexone and patient wanted to start on discharge.  2. Anion Gap with metabolic acidosis with elevated lactate He was admitted with low bicarb and AG acidosis, likely from acute event.  Resolved prior to discharge with fluids.  3. HTN Held home lisinopril in setting of mild renal insufficiency and resumed at discharge.  4. Renal insufficiency Creatinine was above baseline on admission and after  fluid resuscitation acute kidney injury resolved.   Discharge Vitals:   BP 118/85   Pulse 72   Temp 97.9 F (36.6 C) (Oral)   Resp 18   SpO2 96%   Pertinent Labs, Studies, and Procedures:  ECHO, EEG  Discharge Instructions: Discharge Instructions    Diet - low sodium heart healthy   Complete by:  As directed    Discharge instructions   Complete by:  As directed    Albert Tanner, Please follow up with your primary care provider at discharge.  Please take 2 tablets of librium tonight and 2 tablets tomorrow in  the day.  Do not mix with alcohol.  You may start taking naltrexone when discharged.   Increase activity slowly   Complete by:  As directed       Signed: Valinda Party, DO 05/15/2017, 12:46 PM   Pager: [phone]

## 2017-05-17 LAB — FOLATE RBC
FOLATE, RBC: 1123 ng/mL (ref >498)
Folate, Hemolysate: 461.7 ng/mL
HEMATOCRIT: 41.1 % (ref 37.5–51.0)

## 2017-05-17 NOTE — Progress Notes (Unsigned)
CSW spoke with pt in the ED lobby regarding doctors note. Pt asked CSW for a doctors note so that pt can return back to work. CSW informed pt that CSW is unable to give pt a doctors note as that would have to be a doctor. CSW informed pt that pt could speak with  The doctor from Internal medicine to see about getting a doctors note for work. Pt appeared to be understanding and agreeable this and notified CSW that pt would go once wife came. There are no further CSW needs at this time. CSW signing off.     Claude MangesKierra S. Charlean Carneal, MSW, LCSW-A Emergency Department Clinical Social Worker [phone]

## 2017-05-19 ENCOUNTER — Encounter (HOSPITAL_COMMUNITY): Payer: Self-pay

## 2017-05-20 DIAGNOSIS — Z0001 Encounter for general adult medical examination with abnormal findings: Secondary | ICD-10-CM | POA: Diagnosis not present

## 2017-07-30 DIAGNOSIS — H524 Presbyopia: Secondary | ICD-10-CM | POA: Diagnosis not present

## 2017-07-30 DIAGNOSIS — H5213 Myopia, bilateral: Secondary | ICD-10-CM | POA: Diagnosis not present

## 2017-08-13 DIAGNOSIS — I10 Essential (primary) hypertension: Secondary | ICD-10-CM | POA: Diagnosis not present

## 2017-08-13 DIAGNOSIS — K219 Gastro-esophageal reflux disease without esophagitis: Secondary | ICD-10-CM | POA: Diagnosis not present

## 2017-08-13 DIAGNOSIS — E1129 Type 2 diabetes mellitus with other diabetic kidney complication: Secondary | ICD-10-CM | POA: Diagnosis not present

## 2017-08-18 DIAGNOSIS — E119 Type 2 diabetes mellitus without complications: Secondary | ICD-10-CM | POA: Diagnosis not present

## 2017-08-18 DIAGNOSIS — E7800 Pure hypercholesterolemia, unspecified: Secondary | ICD-10-CM | POA: Diagnosis not present

## 2018-02-18 DIAGNOSIS — E1129 Type 2 diabetes mellitus with other diabetic kidney complication: Secondary | ICD-10-CM | POA: Diagnosis not present

## 2018-02-18 DIAGNOSIS — I10 Essential (primary) hypertension: Secondary | ICD-10-CM | POA: Diagnosis not present

## 2018-02-18 DIAGNOSIS — K219 Gastro-esophageal reflux disease without esophagitis: Secondary | ICD-10-CM | POA: Diagnosis not present

## 2018-02-24 DIAGNOSIS — E119 Type 2 diabetes mellitus without complications: Secondary | ICD-10-CM | POA: Diagnosis not present

## 2018-02-24 DIAGNOSIS — Z23 Encounter for immunization: Secondary | ICD-10-CM | POA: Diagnosis not present

## 2018-02-24 DIAGNOSIS — E7800 Pure hypercholesterolemia, unspecified: Secondary | ICD-10-CM | POA: Diagnosis not present

## 2018-06-24 DIAGNOSIS — K219 Gastro-esophageal reflux disease without esophagitis: Secondary | ICD-10-CM | POA: Diagnosis not present

## 2018-06-24 DIAGNOSIS — I10 Essential (primary) hypertension: Secondary | ICD-10-CM | POA: Diagnosis not present

## 2018-06-24 DIAGNOSIS — E1129 Type 2 diabetes mellitus with other diabetic kidney complication: Secondary | ICD-10-CM | POA: Diagnosis not present

## 2018-06-29 DIAGNOSIS — Z6841 Body Mass Index (BMI) 40.0 and over, adult: Secondary | ICD-10-CM | POA: Diagnosis not present

## 2018-06-29 DIAGNOSIS — J111 Influenza due to unidentified influenza virus with other respiratory manifestations: Secondary | ICD-10-CM | POA: Diagnosis not present

## 2018-07-14 DIAGNOSIS — E119 Type 2 diabetes mellitus without complications: Secondary | ICD-10-CM | POA: Diagnosis not present

## 2018-07-14 DIAGNOSIS — E7800 Pure hypercholesterolemia, unspecified: Secondary | ICD-10-CM | POA: Diagnosis not present

## 2018-08-05 DIAGNOSIS — H2513 Age-related nuclear cataract, bilateral: Secondary | ICD-10-CM | POA: Diagnosis not present

## 2018-08-05 DIAGNOSIS — H524 Presbyopia: Secondary | ICD-10-CM | POA: Diagnosis not present

## 2018-08-05 DIAGNOSIS — E119 Type 2 diabetes mellitus without complications: Secondary | ICD-10-CM | POA: Diagnosis not present

## 2019-02-10 ENCOUNTER — Other Ambulatory Visit: Payer: Self-pay | Admitting: Family Medicine

## 2019-02-10 DIAGNOSIS — R600 Localized edema: Secondary | ICD-10-CM

## 2019-02-14 ENCOUNTER — Other Ambulatory Visit: Payer: Commercial Managed Care - HMO

## 2019-02-17 ENCOUNTER — Ambulatory Visit
Admission: RE | Admit: 2019-02-17 | Discharge: 2019-02-17 | Disposition: A | Discharge status: Home / Self Care | Payer: 59 | Source: Ambulatory Visit | Attending: Family Medicine | Admitting: Family Medicine

## 2019-02-17 DIAGNOSIS — R600 Localized edema: Secondary | ICD-10-CM

## 2019-02-17 MED ORDER — IOPAMIDOL (ISOVUE-370) INJECTION 76%
75.0000 mL | Freq: Once | INTRAVENOUS | Status: AC | PRN
  Administered 2019-02-17: 15:00:00 75 mL via INTRAVENOUS

## 2020-06-08 ENCOUNTER — Other Ambulatory Visit: Payer: Self-pay

## 2020-06-08 ENCOUNTER — Encounter (HOSPITAL_COMMUNITY): Payer: Self-pay | Admitting: Emergency Medicine

## 2020-06-08 ENCOUNTER — Emergency Department (HOSPITAL_COMMUNITY)
Admission: EM | Admit: 2020-06-08 | Discharge: 2020-06-08 | Disposition: A | Discharge status: Home / Self Care | Payer: 59 | Attending: Emergency Medicine | Admitting: Emergency Medicine

## 2020-06-08 ENCOUNTER — Emergency Department (HOSPITAL_COMMUNITY): Payer: 59

## 2020-06-08 DIAGNOSIS — I10 Essential (primary) hypertension: Secondary | ICD-10-CM | POA: Insufficient documentation

## 2020-06-08 DIAGNOSIS — X58XXXA Exposure to other specified factors, initial encounter: Secondary | ICD-10-CM | POA: Insufficient documentation

## 2020-06-08 DIAGNOSIS — S8991XA Unspecified injury of right lower leg, initial encounter: Secondary | ICD-10-CM | POA: Diagnosis present

## 2020-06-08 DIAGNOSIS — Z79899 Other long term (current) drug therapy: Secondary | ICD-10-CM | POA: Insufficient documentation

## 2020-06-08 DIAGNOSIS — Z7982 Long term (current) use of aspirin: Secondary | ICD-10-CM | POA: Insufficient documentation

## 2020-06-08 DIAGNOSIS — S76311A Strain of muscle, fascia and tendon of the posterior muscle group at thigh level, right thigh, initial encounter: Secondary | ICD-10-CM | POA: Diagnosis not present

## 2020-06-08 MED ORDER — HYDROMORPHONE HCL 1 MG/ML IJ SOLN
1.0000 mg | Freq: Once | INTRAMUSCULAR | Status: AC
  Administered 2020-06-08: 1 mg via INTRAVENOUS
  Filled 2020-06-08: qty 1

## 2020-06-08 MED ORDER — OXYCODONE HCL 5 MG PO TABS: 5.0000 mg | ORAL_TABLET | Freq: Four times a day (QID) | ORAL | 0 refills | Status: DC | PRN

## 2020-06-08 MED ORDER — OXYCODONE HCL 5 MG PO TABS: 5.0000 mg | ORAL_TABLET | Freq: Four times a day (QID) | ORAL | 0 refills | Status: AC | PRN

## 2020-06-08 MED ORDER — METHOCARBAMOL 1000 MG/10ML IJ SOLN
1000.0000 mg | Freq: Once | INTRAMUSCULAR | Status: AC
  Administered 2020-06-08: 1000 mg via INTRAVENOUS
  Filled 2020-06-08: qty 10

## 2020-06-08 NOTE — ED Notes (Signed)
Pt transported to xray at this time

## 2020-06-08 NOTE — ED Triage Notes (Signed)
Pt arrived by [facility]. Pts boot caught on a plow and hyperextended right leg. Pt c/o of pain to posterior side of upper leg. Pt received of fentanyl by EMS @1238 .

## 2020-06-08 NOTE — ED Provider Notes (Signed)
[facility] EMERGENCY DEPARTMENT Provider Note   CSN: [id number] Arrival date & time: 06/08/20  1244     History Chief Complaint  Patient presents with  . Leg Pain    Albert Tanner is a 60 y.o. male presents to the ED for evaluation of sudden onset, severe right leg pain.  Pain is located on right hamstring.  The pain radiates from his right posterior knee to his right buttock.  It is worse with lifting the leg up and forward. Has been able to put weight on it with pain.  Could not lift leg up to get into a car. EMS brought to ED, given fentanyl en route with improvement in pain. Denies distal leg tingling, numbness. No new low back pain.  Denies any other physical injuries .  History of torn meniscus but does not remember if this was on his right knee or left knee.  HPI     Past Medical History:  Diagnosis Date  . Arthritis   . ETOH abuse   . Hypertension   . Psoriasis 10-21-12   elbows and knees    Patient Active Problem List   Diagnosis Date Noted  . Alcohol withdrawal (HCC) 05/13/2017  . Diverticulosis of colon without hemorrhage   . 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 Morbid obesity (HCC) 07/29/2012  . DM 07/01/2009  . PSORIASIS 07/01/2009  . HEMORRHOIDS, INTERNAL 06/28/2009  . SLEEP APNEA 06/28/2009    Past Surgical History:  Procedure Laterality Date  . BREATH TEK H PYLORI N/A 09/09/2012   Procedure: BREATH TEK H PYLORI;  Surgeon: Mariella Saa, MD;  Location: [facility];  Service: General;  Laterality: N/A;  . COLONOSCOPY N/A 12/20/2015   Procedure: COLONOSCOPY;  Surgeon: Corbin Ade, MD;  Location: [facility];  Service: Endoscopy;  Laterality: N/A;  9:45 Am  . ESOPHAGOGASTRODUODENOSCOPY N/A 10/25/2012   Procedure: ESOPHAGOGASTRODUODENOSCOPY (EGD);  Surgeon: Mariella Saa, MD;  Location: [facility];  Service: General;  Laterality: N/A;  . LAPAROSCOPIC GASTRIC SLEEVE RESECTION N/A 10/25/2012   Procedure: LAPAROSCOPIC GASTRIC  SLEEVE RESECTION;  Surgeon: Mariella Saa, MD;  Location: [facility];  Service: General;  Laterality: N/A;  . MENISCUS REPAIR  03/2010       Family History  Problem Relation Age of Onset  . Dementia Mother   . Cancer Father        Lung  . Cancer Brother        Prostate  . Cancer Brother        Colon    Social History   Tobacco Use  . Smoking status: Never Smoker  . Smokeless tobacco: Never Used  Substance Use Topics  . Alcohol use: Yes    Comment: daily   . Drug use: No    Home Medications Prior to Admission medications   Medication Sig Start Date End Date Taking? Authorizing Provider  oxyCODONE (OXY IR/ROXICODONE) 5 MG immediate release tablet Take 1 tablet (5 mg total) by mouth every 6 (six) hours as needed for up to 3 days for severe pain or breakthrough pain. 06/08/20 06/11/20 Yes Liberty Handy, PA-C  aspirin 81 MG tablet Take 81 mg by mouth daily.    [provider]  chlordiazePOXIDE (LIBRIUM) 25 MG capsule Take 2 capsules (50 mg total) by mouth daily. 05/15/17   Geralyn Corwin Ratliff, DO  lisinopril (PRINIVIL,ZESTRIL) 10 MG tablet Take 10 mg by mouth. 06/07/16   [provider]  Multiple Vitamin (MULTIVITAMIN) tablet  Take 1 tablet by mouth daily.    [provider]  naltrexone (DEPADE) 50 MG tablet Take 1 tablet (50 mg total) by mouth daily. 05/15/17   Geralyn Corwin Ratliff, DO  sertraline (ZOLOFT) 50 MG tablet Take 50 mg by mouth daily. 04/08/17   [provider]  vitamin B-12 (CYANOCOBALAMIN) 100 MCG tablet Take 100 mcg by mouth daily.    [provider]    Allergies    Patient has no known allergies.  Review of Systems   Review of Systems  Musculoskeletal: Positive for gait problem and myalgias.  All other systems reviewed and are negative.   Physical Exam Updated Vital Signs BP (!) 144/102   Pulse 100   Temp 98.9 F (37.2 C) (Oral)   Resp 18   Ht 5\' 11"  (1.803 m)   Wt 131.5 kg   SpO2 95%   BMI  40.45 kg/m   Physical Exam Constitutional:      Appearance: He is well-developed.  HENT:     Head: Normocephalic.     Nose: Nose normal.  Eyes:     General: Lids are normal.  Cardiovascular:     Rate and Rhythm: Normal rate.     Comments: 1+ radial, DP and popliteal pulses in RLE  Pulmonary:     Effort: Pulmonary effort is normal. No respiratory distress.  Musculoskeletal:        General: Tenderness present. Normal range of motion.     Cervical back: Normal range of motion.     Comments: Pelvis: No focal tenderness over bony prominences of hips bilaterally.  No instability with AP L compression of the pelvis.  External genitalia normal without tenderness or bruising.  Patient able to roll onto his left side with some pain. No midline TL spine tenderness.   Right leg: TTP right hamstring, lower buttock and right popliteal space. No ecchymosis.  Compartment soft. No TTP over right calf, anterior thigh or knee. Patient able to flex knee with pain in hamstring. Able to lift leg off bed slightly with pain in hamstring. Full ROM of ankle without pain   Neurological:     Mental Status: He is alert.     Comments: Sensation and strength intact in RLE   Psychiatric:        Behavior: Behavior normal.     ED Results / Procedures / Treatments   Labs (all labs ordered are listed, but only abnormal results are displayed) Labs Reviewed - No data to display  EKG None  Radiology DG Femur Min 2 Views Right  Result Date: 06/08/2020 CLINICAL DATA:  RIGHT posterior thigh and knee pain status post hyperextension injury. EXAM: RIGHT FEMUR 2 VIEWS COMPARISON:  None. FINDINGS: AP and lateral views of the RIGHT femur are provided. Osseous alignment is normal. No fracture line or displaced fracture fragment is seen. Soft tissues about the RIGHT femur are unremarkable. Incidental note made of chronic calcification at the upper pole of the patella, presumably chronic calcific tendinopathy of the quadriceps  tendon insertion site. IMPRESSION: 1. No acute findings. No osseous fracture or dislocation. 2. 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 Electronically Signed   By: 08/06/2020 M.D.   On: 06/08/2020 14:31    Procedures Procedures (including critical care time)  Medications Ordered in ED Medications  HYDROmorphone (DILAUDID) injection 1 mg (1 mg Intravenous Given 06/08/20 1350)  methocarbamol (ROBAXIN) 1,000 mg in dextrose 5 % 100 mL IVPB (  0 mg Intravenous Stopped 06/08/20 1650)  HYDROmorphone (DILAUDID) injection 1 mg (1 mg Intravenous Given 06/08/20 1500)    ED Course  I have reviewed the triage vital signs and the nursing notes.  Pertinent labs & imaging results that were available during my care of the patient were reviewed by me and considered in my medical decision making (see chart for details).    MDM Rules/Calculators/A&P                          EMR triage and nursing notes reviewed  Ordered x-ray femur  Medicines ordered: dilaudid, robaxin  Imaging personally visualized and interpreted  X-ray negative  Differential diagnosis includes strain of hamstring. Doubt DVT. No compartment syndrome. No associated back pain or signs of cauda equina. Extremity is neuro and vascularly intact.  Re-evaluated patient and pain has improved  Will discharge with oxycodone, NSAID, rest, ice, crutches, early ROM and WB as tolerated. PCP follow up, may need ortho referral and PT.   Shared with EDP.  Final Clinical Impression(s) / ED Diagnoses Final diagnoses:  Strain of right hamstring, initial encounter    Rx / DC Orders ED Discharge Orders         Ordered    oxyCODONE (OXY IR/ROXICODONE) 5 MG immediate release tablet  Every 6 hours PRN,   Status:  Discontinued        06/08/20 1603    oxyCODONE (OXY IR/ROXICODONE) 5 MG immediate release tablet  Every 6 hours PRN        06/08/20 1605           Jerrell Mylar 06/08/20 2240    Jacalyn Lefevre, MD 06/09/20 [phone]

## 2020-06-08 NOTE — Discharge Instructions (Signed)
X-ray was normal  We suspect you have a partial tear or strain of your hamstring  Typically these are quite painful and can cause swelling, bruising.  Ice is much as possible at least 3 times daily for 20 minutes at a time.  Elevate your leg.  Rest for the next 2 to 3 days to help with inflammation.  Use your crutches as needed for walking.  After 2 to 3 days of resting you may start to do slight and mild knee and hip range of motion exercises  For pain and inflammation you can use a combination of ibuprofen and acetaminophen.  Take [phone] mg acetaminophen (tylenol) every 6 hours or 600 mg ibuprofen (advil, motrin) every 6 hours.  You can take these separately or combine them every 6 hours for maximum pain control. Do not exceed 4,000 mg acetaminophen or 2,400 mg ibuprofen in a 24 hour period.  Do not take ibuprofen containing products if you have history of kidney disease, ulcers, GI bleeding, severe acid reflux, or take a blood thinner.  Do not take acetaminophen if you have liver disease.   For break through and/or severe pain despite ibuprofen and acetaminophen regimen, take 5 mg oxycodone every 4 hours.  Oxycodone is a narcotic pain medication that has risk of overdose, death, dependence and abuse. Mild and expected side effects include nausea, stomach upset, drowsiness, constipation. Do not consume alcohol, drive or use heavy machinery while taking this medication. Do not leave unattended around children. Flush any remaining pills that you do not use and do not share.  The emergency department has a strict policy regarding prescription of narcotic medications. We prescribe a short course for acute, new pain or injuries. We are unable to refill this medication in the emergency department for chronic pain or repeatedly.  Refill need to be done by specialist or primary care provider or pain clinic.  Contact your primary care provider or specialist for chronic pain management and refill on narcotic  medications.   Follow up with primary care doctor in 1 week for re-evaluation. You may need referral to orthopedist or physical therapy  Return to the ED for evaluation of worsening or severe pain despite pain medicines, toe or lower leg tingling, loss of sensation, purple discoloration

## 2020-12-20 ENCOUNTER — Encounter: Payer: Self-pay | Admitting: *Deleted

## 2020-12-23 ENCOUNTER — Telehealth: Payer: Self-pay | Admitting: Internal Medicine

## 2020-12-23 NOTE — Telephone Encounter (Signed)
PATIENT CAME BY ASKING ABOUT HIS PROCEDURE, I TOLD HIM THAT HE WAS MAILED A QUESTIONAIRE Friday AND FOR HIM TO CALL IF HE DOES NOT GET IT IN A FEW DAYS

## 2020-12-23 NOTE — Telephone Encounter (Signed)
Noted  

## 2021-02-06 ENCOUNTER — Encounter: Payer: Self-pay | Admitting: Gastroenterology

## 2021-06-30 ENCOUNTER — Ambulatory Visit (INDEPENDENT_AMBULATORY_CARE_PROVIDER_SITE_OTHER): Payer: Self-pay | Admitting: *Deleted

## 2021-06-30 ENCOUNTER — Ambulatory Visit: Payer: 59 | Admitting: Gastroenterology

## 2021-06-30 ENCOUNTER — Other Ambulatory Visit: Payer: Self-pay

## 2021-06-30 VITALS — Ht 71.0 in | Wt 290.0 lb

## 2021-06-30 DIAGNOSIS — Z1211 Encounter for screening for malignant neoplasm of colon: Secondary | ICD-10-CM

## 2021-06-30 NOTE — Progress Notes (Signed)
Gastroenterology Pre-Procedure Review  Request Date: 06/30/2021 Requesting Physician: 5 year recall, Last TCS done by Dr. Jena Gauss, mild diverticulosis, normal colon, family hx of colon cancer (brother)  PATIENT REVIEW QUESTIONS: The patient responded to the following health history questions as indicated:    1. Diabetes Melitis: no, borderline per pt, taking Metformin 2. Joint replacements in the past 12 months: no 3. Major health problems in the past 3 months: no 4. Has an artificial valve or MVP: no 5. Has a defibrillator: no 6. Has been advised in past to take antibiotics in advance of a procedure like teeth cleaning: no 7. Family history of colon cancer: yes, brother: age 6's  8. Alcohol Use: yes, 2 beers a day 9. Illicit drug Use: no 10. History of sleep apnea: yes, CPAP 11. History of coronary artery or other vascular stents placed within the last 12 months: no 12. History of any prior anesthesia complications:  13. Body mass index is 40.45 kg/m.    MEDICATIONS & ALLERGIES:    Patient reports the following regarding taking any blood thinners:   Plavix? no Aspirin? Yes, 81 mg daily Coumadin? no Brilinta? no Xarelto? no Eliquis? no Pradaxa? no Savaysa? no Effient? no  Patient confirms/reports the following medications:  Current Outpatient Medications  Medication Sig Dispense Refill   aspirin 81 MG tablet Take 81 mg by mouth daily.     lisinopril-hydrochlorothiazide (ZESTORETIC) 10-12.5 MG tablet Take 1 tablet by mouth daily.     magnesium oxide (MAG-OX) 400 MG tablet Take 400 mg by mouth daily.     metFORMIN (GLUCOPHAGE) 500 MG tablet Take by mouth daily at 6 (six) AM.     Multiple Vitamin (MULTIVITAMIN) tablet Take 1 tablet by mouth daily.     sertraline (ZOLOFT) 50 MG tablet Take 50 mg by mouth daily. Takes 1 and 1/2 tablet a day.     No current facility-administered medications for this visit.    Patient confirms/reports the following allergies:  No Known  Allergies  No orders of the defined types were placed in this encounter.   AUTHORIZATION INFORMATION Primary Insurance: [facility],  [place] #: [id number],  Group #: [id number] Pre-Cert / Berkley Harvey required:  Pre-Cert / Auth #:   SCHEDULE INFORMATION: Procedure has been scheduled as follows:  Date: , Time:   Location: [facility] with Dr. Jena Gauss  This Gastroenterology Pre-Precedure Review Form is being routed to the following provider(s): Tana Coast, PA-C

## 2021-06-30 NOTE — Progress Notes (Signed)
Pt denies any issues with diarrhea.

## 2021-06-30 NOTE — Progress Notes (Signed)
Ok to schedule with propofol. ASA 3.  Day of prep: hold metformin Am of TCS: hold metformin

## 2021-07-01 NOTE — Progress Notes (Signed)
Spoke with pt.  He was made aware that we will contact him to schedule colonoscopy once Dr. Roseanne Kaufman Mar/ Apr procedure schedules available.  Pt voiced understanding.

## 2021-07-31 NOTE — Progress Notes (Signed)
Pt made aware that I will call him once April schedules are available for Dr. Gala Romney.

## 2021-08-13 ENCOUNTER — Encounter: Payer: Self-pay | Admitting: *Deleted

## 2021-08-13 MED ORDER — CLENPIQ 10-3.5-12 MG-GM -GM/160ML PO SOLN: 1.0000 | Freq: Once | ORAL | 0 refills | Status: AC

## 2021-08-14 ENCOUNTER — Other Ambulatory Visit: Payer: Self-pay | Admitting: *Deleted

## 2021-08-20 NOTE — Patient Instructions (Signed)
?   Your procedure is scheduled on: 08/28/2021 ? Report to [facility] Main Entrance at  11:30   AM. ? Call this number if you have problems the morning of surgery: [phone] ? ? Remember: ? ?            Follow Directions on the letter you received from Your Physician's office regarding the Bowel Prep ? ?            No Smoking the day of Procedure : ? ? Take these medicines the morning of surgery with A SIP OF WATER: Zoloft ? ?No diabetic medication am of procedure ? ? Do not wear jewelry, make-up or nail polish. ?  ? Do not bring valuables to the hospital. ? Contacts, dentures or bridgework may not be worn into surgery. ? . ? ? Patients discharged the day of surgery will not be allowed to drive home. ?  ?  ?Colonoscopy, Adult, Care After ?This sheet gives you information about how to care for yourself after your procedure. Your health care provider may also give you more specific instructions. If you have problems or questions, contact your health care provider. ?What can I expect after the procedure? ?After the procedure, it is common to have: ?A small amount of blood in your stool for 24 hours after the procedure. ?Some gas. ?Mild abdominal cramping or bloating. ? ?Follow these instructions at home: ?General instructions ? ?For the first 24 hours after the procedure: ?Do not drive or use machinery. ?Do not sign important documents. ?Do not drink alcohol. ?Do your regular daily activities at a slower pace than normal. ?Eat soft, easy-to-digest foods. ?Rest often. ?Take over-the-counter or prescription medicines only as told by your health care provider. ?It is up to you to get the results of your procedure. Ask your health care provider, or the department performing the procedure, when your results will be ready. ?Relieving cramping and bloating ?Try walking around when you have cramps or feel bloated. ?Apply heat to your abdomen as told by your health care provider. Use a heat source that your health care  provider recommends, such as a moist heat pack or a heating pad. ?Place a towel between your skin and the heat source. ?Leave the heat on for 20-30 minutes. ?Remove the heat if your skin turns bright red. This is especially important if you are unable to feel pain, heat, or cold. You may have a greater risk of getting burned. ?Eating and drinking ?Drink enough fluid to keep your urine clear or pale yellow. ?Resume your normal diet as instructed by your health care provider. Avoid heavy or fried foods that are hard to digest. ?Avoid drinking alcohol for as long as instructed by your health care provider. ?Contact a health care provider if: ?You have blood in your stool 2-3 days after the procedure. ?Get help right away if: ?You have more than a small spotting of blood in your stool. ?You pass large blood clots in your stool. ?Your abdomen is swollen. ?You have nausea or vomiting. ?You have a fever. ?You have increasing abdominal pain that is not relieved with medicine. ?This information is not intended to replace advice given to you by your health care provider. Make sure you discuss any questions you have with your health care provider. ?Document Released: 12/31/2003 Document Revised: 02/10/2016 Document Reviewed: 07/30/2015 ?Elsevier Interactive Patient Education ? 2018 [employer] Inc[employer]  ?

## 2021-08-25 ENCOUNTER — Other Ambulatory Visit (HOSPITAL_COMMUNITY): Payer: 59

## 2021-08-25 ENCOUNTER — Encounter (HOSPITAL_COMMUNITY): Payer: Self-pay

## 2021-08-25 ENCOUNTER — Encounter (HOSPITAL_COMMUNITY)
Admission: RE | Admit: 2021-08-25 | Discharge: 2021-08-25 | Disposition: A | Discharge status: Home / Self Care | Payer: 59 | Source: Ambulatory Visit | Attending: Internal Medicine | Admitting: Internal Medicine

## 2021-08-25 DIAGNOSIS — Z79899 Other long term (current) drug therapy: Secondary | ICD-10-CM

## 2021-08-26 NOTE — Patient Instructions (Signed)
?   Your procedure is scheduled on: 08/28/2021 ? Report to [facility] Main Entrance at    11:50 AM. ? Call this number if you have problems the morning of surgery: [phone] ? ? Remember: ? ?            Follow Directions on the letter you received from Your Physician's office regarding the Bowel Prep ? ?            No Smoking the day of Procedure : ? ? Take these medicines the morning of surgery with A SIP OF WATER: Zoloft ?            No diabetic medication am of procedure ? ? Do not wear jewelry, make-up or nail polish. ?  ? Do not bring valuables to the hospital. ? Contacts, dentures or bridgework may not be worn into surgery. ? . ? ? Patients discharged the day of surgery will not be allowed to drive home. ?  ?  ?Colonoscopy, Adult, Care After ?This sheet gives you information about how to care for yourself after your procedure. Your health care provider may also give you more specific instructions. If you have problems or questions, contact your health care provider. ?What can I expect after the procedure? ?After the procedure, it is common to have: ?A small amount of blood in your stool for 24 hours after the procedure. ?Some gas. ?Mild abdominal cramping or bloating. ? ?Follow these instructions at home: ?General instructions ? ?For the first 24 hours after the procedure: ?Do not drive or use machinery. ?Do not sign important documents. ?Do not drink alcohol. ?Do your regular daily activities at a slower pace than normal. ?Eat soft, easy-to-digest foods. ?Rest often. ?Take over-the-counter or prescription medicines only as told by your health care provider. ?It is up to you to get the results of your procedure. Ask your health care provider, or the department performing the procedure, when your results will be ready. ?Relieving cramping and bloating ?Try walking around when you have cramps or feel bloated. ?Apply heat to your abdomen as told by your health care provider. Use a heat source that your health  care provider recommends, such as a moist heat pack or a heating pad. ?Place a towel between your skin and the heat source. ?Leave the heat on for 20-30 minutes. ?Remove the heat if your skin turns bright red. This is especially important if you are unable to feel pain, heat, or cold. You may have a greater risk of getting burned. ?Eating and drinking ?Drink enough fluid to keep your urine clear or pale yellow. ?Resume your normal diet as instructed by your health care provider. Avoid heavy or fried foods that are hard to digest. ?Avoid drinking alcohol for as long as instructed by your health care provider. ?Contact a health care provider if: ?You have blood in your stool 2-3 days after the procedure. ?Get help right away if: ?You have more than a small spotting of blood in your stool. ?You pass large blood clots in your stool. ?Your abdomen is swollen. ?You have nausea or vomiting. ?You have a fever. ?You have increasing abdominal pain that is not relieved with medicine. ?This information is not intended to replace advice given to you by your health care provider. Make sure you discuss any questions you have with your health care provider. ?Document Released: 12/31/2003 Document Revised: 02/10/2016 Document Reviewed: 07/30/2015 ?Elsevier Interactive Patient Education ? 2018 Elsevier Inc.  ?

## 2021-08-27 ENCOUNTER — Encounter (HOSPITAL_COMMUNITY)
Admission: RE | Admit: 2021-08-27 | Discharge: 2021-08-27 | Disposition: A | Discharge status: Home / Self Care | Payer: 59 | Source: Ambulatory Visit | Attending: Internal Medicine | Admitting: Internal Medicine

## 2021-08-27 ENCOUNTER — Encounter (HOSPITAL_COMMUNITY): Payer: Self-pay

## 2021-08-27 VITALS — BP 147/98 | HR 104 | Temp 97.5°F | Resp 18 | Ht 71.0 in | Wt 298.0 lb

## 2021-08-27 DIAGNOSIS — Z79899 Other long term (current) drug therapy: Secondary | ICD-10-CM | POA: Diagnosis not present

## 2021-08-27 DIAGNOSIS — Z01818 Encounter for other preprocedural examination: Secondary | ICD-10-CM | POA: Diagnosis present

## 2021-08-27 LAB — BASIC METABOLIC PANEL
Anion gap: 8 (ref 5–15)
BUN: 15 mg/dL (ref 8–23)
CO2: 28 mmol/L (ref 22–32)
Calcium: 8.5 mg/dL — ABNORMAL LOW (ref 8.9–10.3)
Chloride: 100 mmol/L (ref 98–111)
Creatinine, Ser: 0.91 mg/dL (ref 0.61–1.24)
GFR, Estimated: >60 mL/min (ref >60)
Glucose, Bld: 207 mg/dL — ABNORMAL HIGH (ref 70–99)
Potassium: 4.1 mmol/L (ref 3.5–5.1)
Sodium: 136 mmol/L (ref 135–145)

## 2021-08-28 ENCOUNTER — Ambulatory Visit (HOSPITAL_COMMUNITY)
Admission: RE | Admit: 2021-08-28 | Discharge: 2021-08-28 | Disposition: A | Discharge status: Home / Self Care | Payer: 59 | Attending: Internal Medicine | Admitting: Internal Medicine

## 2021-08-28 ENCOUNTER — Ambulatory Visit (HOSPITAL_COMMUNITY): Payer: 59 | Admitting: Anesthesiology

## 2021-08-28 ENCOUNTER — Encounter (HOSPITAL_COMMUNITY)
Admission: RE | Disposition: A | Discharge status: Home / Self Care | Payer: Self-pay | Source: Home / Self Care | Attending: Internal Medicine

## 2021-08-28 ENCOUNTER — Other Ambulatory Visit: Payer: Self-pay

## 2021-08-28 ENCOUNTER — Ambulatory Visit (HOSPITAL_BASED_OUTPATIENT_CLINIC_OR_DEPARTMENT_OTHER): Payer: 59 | Admitting: Anesthesiology

## 2021-08-28 ENCOUNTER — Encounter (HOSPITAL_COMMUNITY): Payer: Self-pay | Admitting: Internal Medicine

## 2021-08-28 DIAGNOSIS — I10 Essential (primary) hypertension: Secondary | ICD-10-CM | POA: Diagnosis not present

## 2021-08-28 DIAGNOSIS — G4730 Sleep apnea, unspecified: Secondary | ICD-10-CM | POA: Insufficient documentation

## 2021-08-28 DIAGNOSIS — Z7984 Long term (current) use of oral hypoglycemic drugs: Secondary | ICD-10-CM | POA: Insufficient documentation

## 2021-08-28 DIAGNOSIS — E119 Type 2 diabetes mellitus without complications: Secondary | ICD-10-CM

## 2021-08-28 DIAGNOSIS — Z6841 Body Mass Index (BMI) 40.0 and over, adult: Secondary | ICD-10-CM | POA: Insufficient documentation

## 2021-08-28 DIAGNOSIS — Z79899 Other long term (current) drug therapy: Secondary | ICD-10-CM | POA: Insufficient documentation

## 2021-08-28 DIAGNOSIS — Z1211 Encounter for screening for malignant neoplasm of colon: Secondary | ICD-10-CM | POA: Diagnosis present

## 2021-08-28 DIAGNOSIS — K5730 Diverticulosis of large intestine without perforation or abscess without bleeding: Secondary | ICD-10-CM | POA: Diagnosis not present

## 2021-08-28 DIAGNOSIS — Z800 Family history of malignant neoplasm of digestive organs: Secondary | ICD-10-CM | POA: Diagnosis not present

## 2021-08-28 HISTORY — PX: COLONOSCOPY WITH PROPOFOL: SHX5780

## 2021-08-28 LAB — GLUCOSE, CAPILLARY: Glucose-Capillary: 174 mg/dL — ABNORMAL HIGH (ref 70–99)

## 2021-08-28 SURGERY — COLONOSCOPY WITH PROPOFOL
Anesthesia: General

## 2021-08-28 MED ORDER — LACTATED RINGERS IV SOLN: INTRAVENOUS | Status: DC

## 2021-08-28 MED ORDER — LIDOCAINE HCL (CARDIAC) PF 100 MG/5ML IV SOSY
PREFILLED_SYRINGE | INTRAVENOUS | Status: DC | PRN
  Administered 2021-08-28: 50 mg via INTRAVENOUS

## 2021-08-28 MED ORDER — CHLORHEXIDINE GLUCONATE CLOTH 2 % EX PADS: 6.0000 | MEDICATED_PAD | Freq: Once | CUTANEOUS | Status: DC

## 2021-08-28 MED ORDER — STERILE WATER FOR IRRIGATION IR SOLN
Status: DC | PRN
  Administered 2021-08-28 (×3): 50 mL

## 2021-08-28 MED ORDER — PROPOFOL 10 MG/ML IV BOLUS
INTRAVENOUS | Status: DC | PRN
  Administered 2021-08-28: 30 mg via INTRAVENOUS
  Administered 2021-08-28: 100 mg via INTRAVENOUS
  Administered 2021-08-28: 50 mg via INTRAVENOUS

## 2021-08-28 MED ORDER — PROPOFOL 500 MG/50ML IV EMUL
INTRAVENOUS | Status: DC | PRN
  Administered 2021-08-28: 150 ug/kg/min via INTRAVENOUS

## 2021-08-28 NOTE — Anesthesia Procedure Notes (Signed)
Date/Time: 08/28/2021 2:44 PM ?Performed by: Julian Reil, CRNA ?Pre-anesthesia Checklist: Patient identified, Emergency Drugs available, Suction available and Patient being monitored ?Patient Re-evaluated:Patient Re-evaluated prior to induction ?Oxygen Delivery Method: Non-rebreather mask ?Induction Type: IV induction ?Placement Confirmation: positive ETCO2 ? ? ? ? ?

## 2021-08-28 NOTE — H&P (Signed)
@LOGO @ ? ? ?Primary Care Physician:  , MD ?Primary Gastroenterologist:  Dr. Estanislado Pandy ? ?Pre-Procedure History & Physical: ?HPI:  Albert Tanner is a 61 y.o. male here for high risk screening colonoscopy.  History of colon cancer in his brother at a young age.  No bowel symptoms currently.  Negative colonoscopy about 5 years ago. ? ?Past Medical History:  ?Diagnosis Date  ? Arthritis   ? Diabetes mellitus without complication (HCC)   ? ETOH abuse   ? Hypertension   ? Psoriasis 10/21/2012  ? elbows and knees  ? Sleep apnea   ? ? ?Past Surgical History:  ?Procedure Laterality Date  ? BREATH TEK H PYLORI N/A 09/09/2012  ? Procedure: BREATH TEK H PYLORI;  Surgeon: 11/09/2012, MD;  Location: [facility];  Service: General;  Laterality: N/A;  ? CHOLECYSTECTOMY    ? COLONOSCOPY N/A 12/20/2015  ? Procedure: COLONOSCOPY;  Surgeon: 12/22/2015, MD;  Location: [facility];  Service: Endoscopy;  Laterality: N/A;  9:45 Am  ? ESOPHAGOGASTRODUODENOSCOPY N/A 10/25/2012  ? Procedure: ESOPHAGOGASTRODUODENOSCOPY (EGD);  Surgeon: 10/27/2012, MD;  Location: [facility];  Service: General;  Laterality: N/A;  ? LAPAROSCOPIC GASTRIC SLEEVE RESECTION N/A 10/25/2012  ? Procedure: LAPAROSCOPIC GASTRIC SLEEVE RESECTION;  Surgeon: 10/27/2012, MD;  Location: [facility];  Service: General;  Laterality: N/A;  ? MENISCUS REPAIR  03/01/2010  ? ? ?Prior to Admission medications   ?Medication Sig Start Date End Date Taking? Authorizing Provider  ?aspirin 81 MG tablet Take 81 mg by mouth daily.   Yes [provider]  ?lisinopril-hydrochlorothiazide (ZESTORETIC) 10-12.5 MG tablet Take 1 tablet by mouth daily. 06/18/21  Yes [provider]  ?magnesium oxide (MAG-OX) 400 MG tablet Take 400 mg by mouth daily.   Yes [provider]  ?metFORMIN (GLUCOPHAGE-XR) 500 MG 24 hr tablet Take 1,000 mg by mouth daily. 08/12/21  Yes [provider]  ?Multiple Vitamin (MULTIVITAMIN) tablet Take 1  tablet by mouth daily.   Yes [provider]  ?sertraline (ZOLOFT) 50 MG tablet Take 75 mg by mouth daily. 04/08/17  Yes [provider]  ?Omega-3 Fatty Acids (FISH OIL) 1200 MG CPDR Take by mouth.    [provider]  ?zinc gluconate 50 MG tablet Take 50 mg by mouth daily.    [provider]  ? ? ?Allergies as of 08/13/2021  ? (No Known Allergies)  ? ? ?Family History  ?Problem Relation Age of Onset  ? Dementia Mother   ? Cancer Father   ?     Lung  ? Cancer Brother   ?     Prostate  ? Cancer Brother   ?     Colon  ? ? ?Social History  ? ?Socioeconomic History  ? Marital status: Married  ?  Spouse name: Not on file  ? Number of children: Not on file  ? Years of education: Not on file  ? Highest education level: Not on file  ?Occupational History  ? Not on file  ?Tobacco Use  ? Smoking status: Never  ? Smokeless tobacco: Never  ?Substance and Sexual Activity  ? Alcohol use: Yes  ?  Comment: daily   ? Drug use: No  ? Sexual activity: Yes  ?Other Topics Concern  ? Not on file  ?Social History Narrative  ? Not on file  ? ?Social Determinants of Health  ? ?Financial Resource Strain: Not on file  ?Food Insecurity: Not on file  ?  Transportation Needs: Not on file  ?Physical Activity: Not on file  ?Stress: Not on file  ?Social Connections: Not on file  ?Intimate Partner Violence: Not on file  ? ? ?Review of Systems: ?See HPI, otherwise negative ROS ? ?Physical Exam: ?BP 128/89   Pulse (!) 101   Temp 97.8 ?F (36.6 ?C)   Resp (!) 23   SpO2 95%  ?General:   Alert,  Well-developed, well-nourished, pleasant and cooperative in NAD ?Neck:  Supple; no masses or thyromegaly. No significant cervical adenopathy. ?Lungs:  Clear throughout to auscultation.   No wheezes, crackles, or rhonchi. No acute distress. ?Heart:  Regular rate and rhythm; no murmurs, clicks, rubs,  or gallops. ?Abdomen: Non-distended, normal bowel sounds.  Soft and nontender without appreciable mass or hepatosplenomegaly.   ?Pulses:  Normal pulses noted. ?Extremities:  Without clubbing or edema. ? ?Impression/Plan: 61 year old gentleman with a positive family history of colon cancer first-degree relative.  Here for a high risk screening colonoscopy.  I have offered the patient a high risk screening colonoscopy per plan. ?The risks, benefits, limitations, alternatives and imponderables have been reviewed with the patient. Questions have been answered. All parties are agreeable.   ? ? ? ? ?Notice: This dictation was prepared with Dragon dictation along with smaller phrase technology. Any transcriptional errors that result from this process are unintentional and may not be corrected upon review.  ? ?

## 2021-08-28 NOTE — Anesthesia Postprocedure Evaluation (Signed)
Anesthesia Post Note ? ?Patient: Albert Tanner ? ?Procedure(s) Performed: COLONOSCOPY WITH PROPOFOL ? ?Patient location during evaluation: Phase II ?Anesthesia Type: General ?Level of consciousness: awake ?Pain management: pain level controlled ?Vital Signs Assessment: post-procedure vital signs reviewed and stable ?Respiratory status: spontaneous breathing and respiratory function stable ?Cardiovascular status: blood pressure returned to baseline and stable ?Postop Assessment: no headache and no apparent nausea or vomiting ?Anesthetic complications: no ?Comments: Late entry ? ? ?No notable events documented. ? ? ?Last Vitals:  ?Vitals:  ? 08/28/21 1217 08/28/21 1455  ?BP: 128/89 (!) 97/56  ?Pulse: (!) 101 100  ?Resp: (!) 23 12  ?Temp: 36.6 ?C 36.5 ?C  ?SpO2: 95% 94%  ?  ?Last Pain:  ?Vitals:  ? 08/28/21 1455  ?TempSrc: Axillary  ?PainSc: 0-No pain  ? ? ?  ?  ?  ?  ?  ?  ? ?Windell Norfolk ? ? ? ? ?

## 2021-08-28 NOTE — Discharge Instructions (Signed)
?  Colonoscopy ?Discharge Instructions ? ?Read the instructions outlined below and refer to this sheet in the next few weeks. These discharge instructions provide you with general information on caring for yourself after you leave the hospital. Your doctor may also give you specific instructions. While your treatment has been planned according to the most current medical practices available, unavoidable complications occasionally occur. If you have any problems or questions after discharge, call Dr. Jena Gauss at [phone]. ?ACTIVITY ?You may resume your regular activity, but move at a slower pace for the next 24 hours.  ?Take frequent rest periods for the next 24 hours.  ?Walking will help get rid of the air and reduce the bloated feeling in your belly (abdomen).  ?No driving for 24 hours (because of the medicine (anesthesia) used during the test).   ?Do not sign any important legal documents or operate any machinery for 24 hours (because of the anesthesia used during the test).  ?NUTRITION ?Drink plenty of fluids.  ?You may resume your normal diet as instructed by your doctor.  ?Begin with a light meal and progress to your normal diet. Heavy or fried foods are harder to digest and may make you feel sick to your stomach (nauseated).  ?Avoid alcoholic beverages for 24 hours or as instructed.  ?MEDICATIONS ?You may resume your normal medications unless your doctor tells you otherwise.  ?WHAT YOU CAN EXPECT TODAY ?Some feelings of bloating in the abdomen.  ?Passage of more gas than usual.  ?Spotting of blood in your stool or on the toilet paper.  ?IF YOU HAD POLYPS REMOVED DURING THE COLONOSCOPY: ?No aspirin products for 7 days or as instructed.  ?No alcohol for 7 days or as instructed.  ?Eat a soft diet for the next 24 hours.  ?FINDING OUT THE RESULTS OF YOUR TEST ?Not all test results are available during your visit. If your test results are not back during the visit, make an appointment with your caregiver to find out the  results. Do not assume everything is normal if you have not heard from your caregiver or the medical facility. It is important for you to follow up on all of your test results.  ?SEEK IMMEDIATE MEDICAL ATTENTION IF: ?You have more than a spotting of blood in your stool.  ?Your belly is swollen (abdominal distention).  ?You are nauseated or vomiting.  ?You have a temperature over 101.  ?You have abdominal pain or discomfort that is severe or gets worse throughout the day.   ? ? ?No polyps found today. ? ?Diverticulosis information provided ? ?It is recommended you return for repeat colonoscopy in 5 years ? ?At patient request, I called Rose at [phone] findings and recommendations ?

## 2021-08-28 NOTE — Transfer of Care (Signed)
Immediate Anesthesia Transfer of Care Note ? ?Patient: Albert Tanner ? ?Procedure(s) Performed: COLONOSCOPY WITH PROPOFOL ? ?Patient Location: Short Stay ? ?Anesthesia Type:General ? ?Level of Consciousness: awake ? ?Airway & Oxygen Therapy: Patient Spontanous Breathing ? ?Post-op Assessment: Report given to RN and Post -op Vital signs reviewed and stable ? ?Post vital signs: Reviewed and stable ? ?Last Vitals:  ?Vitals Value Taken Time  ?BP    ?Temp    ?Pulse    ?Resp    ?SpO2    ? ? ?Last Pain:  ?Vitals:  ? 08/28/21 1438  ?PainSc: 0-No pain  ?   ? ?Patients Stated Pain Goal: 5 (08/28/21 1217) ? ?Complications: No notable events documented. ?

## 2021-08-28 NOTE — Anesthesia Preprocedure Evaluation (Signed)
Anesthesia Evaluation  ?Patient identified by MRN, date of birth, ID band ?Patient awake ? ? ? ?Reviewed: ?Allergy & Precautions, H&P , NPO status , Patient's Chart, lab work & pertinent test results, reviewed documented beta blocker date and time  ? ?Airway ?Mallampati: II ? ?TM Distance: >3 FB ?Neck ROM: full ? ? ? Dental ?no notable dental hx. ? ?  ?Pulmonary ?sleep apnea and Continuous Positive Airway Pressure Ventilation ,  ?  ?Pulmonary exam normal ?breath sounds clear to auscultation ? ? ? ? ? ? Cardiovascular ?Exercise Tolerance: Good ?hypertension, negative cardio ROS ? ? ?Rhythm:regular Rate:Normal ? ? ?  ?Neuro/Psych ?negative neurological ROS ? negative psych ROS  ? GI/Hepatic ?negative GI ROS, (+)  ?  ? substance abuse ? alcohol use,   ?Endo/Other  ?diabetes, Type obesity ? Renal/GU ?negative Renal ROS  ?negative genitourinary ?  ?Musculoskeletal ? ? Abdominal ?  ?Peds ? Hematology ?negative hematology ROS ?(+)   ?Anesthesia Other Findings ? ? Reproductive/Obstetrics ?negative OB ROS ? ?  ? ? ? ? ? ? ? ? ? ? ? ? ? ?  ?  ? ? ? ? ? ? ? ? ?Anesthesia Physical ?Anesthesia Plan ? ?ASA: 3 ? ?Anesthesia Plan: General  ? ?Post-op Pain Management:   ? ?Induction:  ? ?PONV Risk Score and Plan: Propofol infusion ? ?Airway Management Planned:  ? ?Additional Equipment:  ? ?Intra-op Plan:  ? ?Post-operative Plan:  ? ?Informed Consent: I have reviewed the patients History and Physical, chart, labs and discussed the procedure including the risks, benefits and alternatives for the proposed anesthesia with the patient or authorized representative who has indicated his/her understanding and acceptance.  ? ? ? ?Dental Advisory Given ? ?Plan Discussed with: CRNA ? ?Anesthesia Plan Comments:   ? ? ? ? ? ? ?Anesthesia Quick Evaluation ? ?

## 2021-08-28 NOTE — Op Note (Signed)
[facility] ?Patient Name: Albert Tanner ?Procedure Date: 08/28/2021 2:23 PM ?MRN: [id number] ?Date of Birth: Jun 17, 1960 ?Attending MD: Norvel Richards , MD ?CSN: [id number] ?Age: 61 ?Admit Type: Outpatient ?Procedure:                Colonoscopy ?Indications:              Screening in patient at increased risk: Family  ?                          history of 1st-degree relative with colorectal  ?                          cancer before age 6 years ?Providers:                Norvel Richards, MD, Charlsie Quest Theda Sers RN, Therapist, sports,  ?                          Raphael Gibney, Technician ?Referring MD:              ?Medicines:                Propofol per Anesthesia ?Complications:            No immediate complications. ?Estimated Blood Loss:     Estimated blood loss: none. ?Procedure:                Pre-Anesthesia Assessment: ?                          - Prior to the procedure, a History and Physical  ?                          was performed, and patient medications and  ?                          allergies were reviewed. The patient's tolerance of  ?                          previous anesthesia was also reviewed. The risks  ?                          and benefits of the procedure and the sedation  ?                          options and risks were discussed with the patient.  ?                          All questions were answered, and informed consent  ?                          was obtained. Prior Anticoagulants: The patient has  ?                          taken no previous anticoagulant or antiplatelet  ?  agents. ASA Grade Assessment: III - A patient with  ?                          severe systemic disease. After reviewing the risks  ?                          and benefits, the patient was deemed in  ?                          satisfactory condition to undergo the procedure. ?                          After obtaining informed consent, the colonoscope  ?                          was passed  under direct vision. Throughout the  ?                          procedure, the patient's blood pressure, pulse, and  ?                          oxygen saturations were monitored continuously. The  ?                          [id number]) scope was introduced through the  ?                          anus and advanced to the the cecum, identified by  ?                          appendiceal orifice and ileocecal valve. The  ?                          colonoscopy was performed without difficulty. The  ?                          patient tolerated the procedure well. The quality  ?                          of the bowel preparation was adequate. ?Scope In: 2:39:45 PM ?Scope Out: 2:51:32 PM ?Scope Withdrawal Time: 0 hours 7 minutes 8 seconds  ?Total Procedure Duration: 0 hours 11 minutes 47 seconds  ?Findings: ?     The perianal and digital rectal examinations were normal. ?     Scattered medium-mouthed diverticula were found in the sigmoid colon and  ?     descending colon. ?     The exam was otherwise without abnormality on direct and retroflexion  ?     views. ?Impression:               - Diverticulosis in the sigmoid colon and in the  ?                          descending colon. ?                          -  The examination was otherwise normal on direct  ?                          and retroflexion views. ?                          - No specimens collected. ?Moderate Sedation: ?     Moderate (conscious) sedation was personally administered by an  ?     anesthesia professional. The following parameters were monitored: oxygen  ?     saturation, heart rate, blood pressure, respiratory rate, EKG, adequacy  ?     of pulmonary ventilation, and response to care. ?Recommendation:           - Patient has a contact number available for  ?                          emergencies. The signs and symptoms of potential  ?                          delayed complications were discussed with the  ?                          patient. Return  to normal activities tomorrow.  ?                          Written discharge instructions were provided to the  ?                          patient. ?                          - Resume previous diet. ?                          - Continue present medications. ?Procedure Code(s):        --- Professional --- ?                          [phone], Colonoscopy, flexible; diagnostic, including  ?                          collection of specimen(s) by brushing or washing,  ?                          when performed (separate procedure) ?Diagnosis Code(s):        --- Professional --- ?                          Z80.0, Family history of malignant neoplasm of  ?                          digestive organs ?                          K57.30, Diverticulosis of large intestine without  ?  perforation or abscess without bleeding ?CPT copyright 2019 [employer]. All rights reserved. ?The codes documented in this report are preliminary and upon coder review may  ?be revised to meet current compliance requirements. ?[name]. [name], MD ?Norvel Richards, MD ?08/28/2021 3:06:02 PM ?This report has been signed electronically. ?Number of Addenda: 0 ?

## 2021-09-02 ENCOUNTER — Encounter (HOSPITAL_COMMUNITY): Payer: Self-pay | Admitting: Internal Medicine

## 2022-05-29 IMAGING — DX DG FEMUR 2+V*R*
7 series · 7 of 7 positions shown · non-contrast
Comparison: None.

CLINICAL DATA: RIGHT posterior thigh and knee pain status post
hyperextension injury.

EXAM:
RIGHT FEMUR 2 VIEWS

[femur ap (1 of 3)]
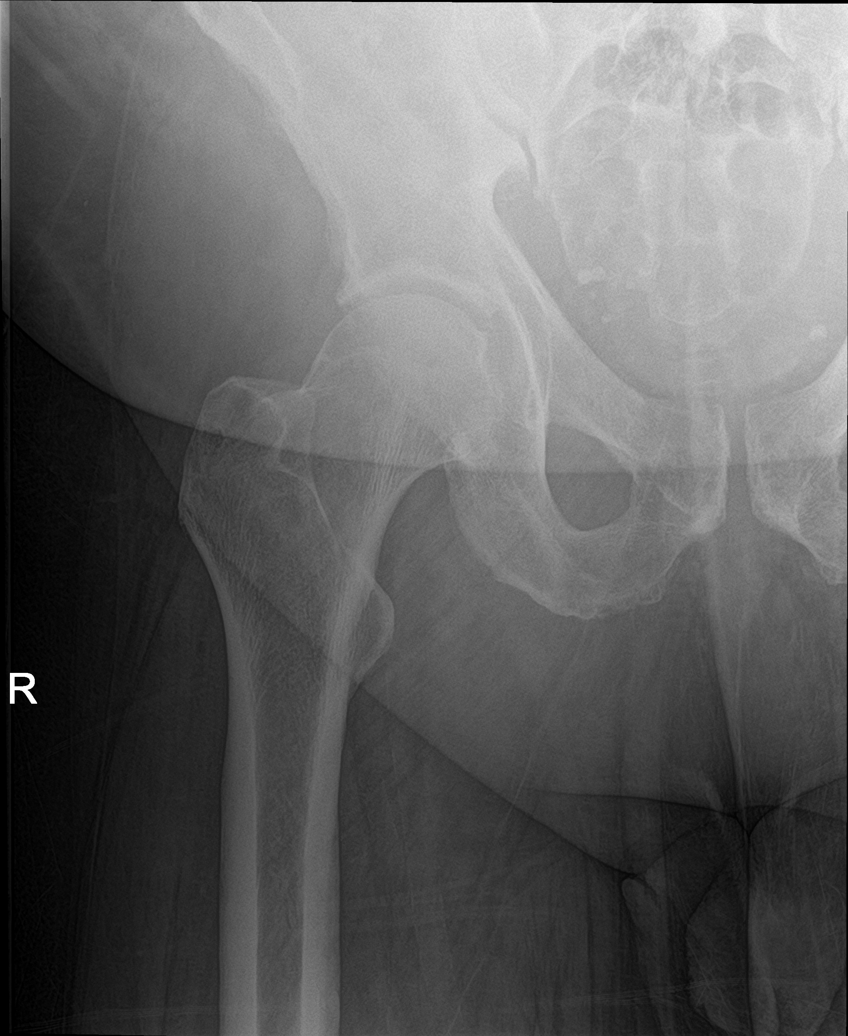

[femur ap (2 of 3)]
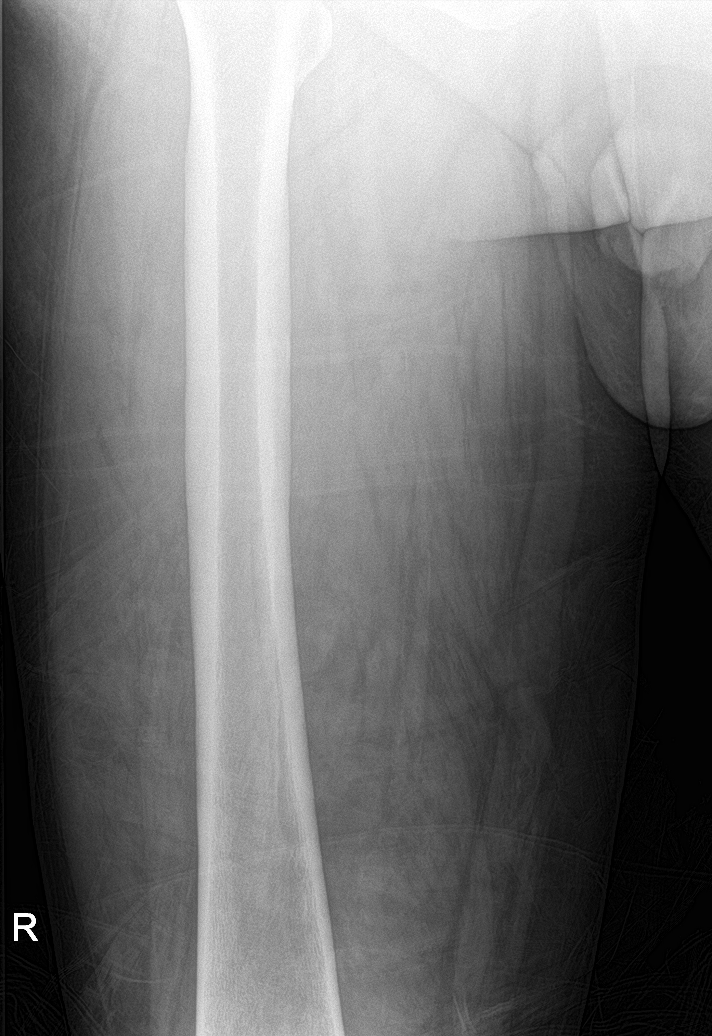

[femur lat (1 of 4)]
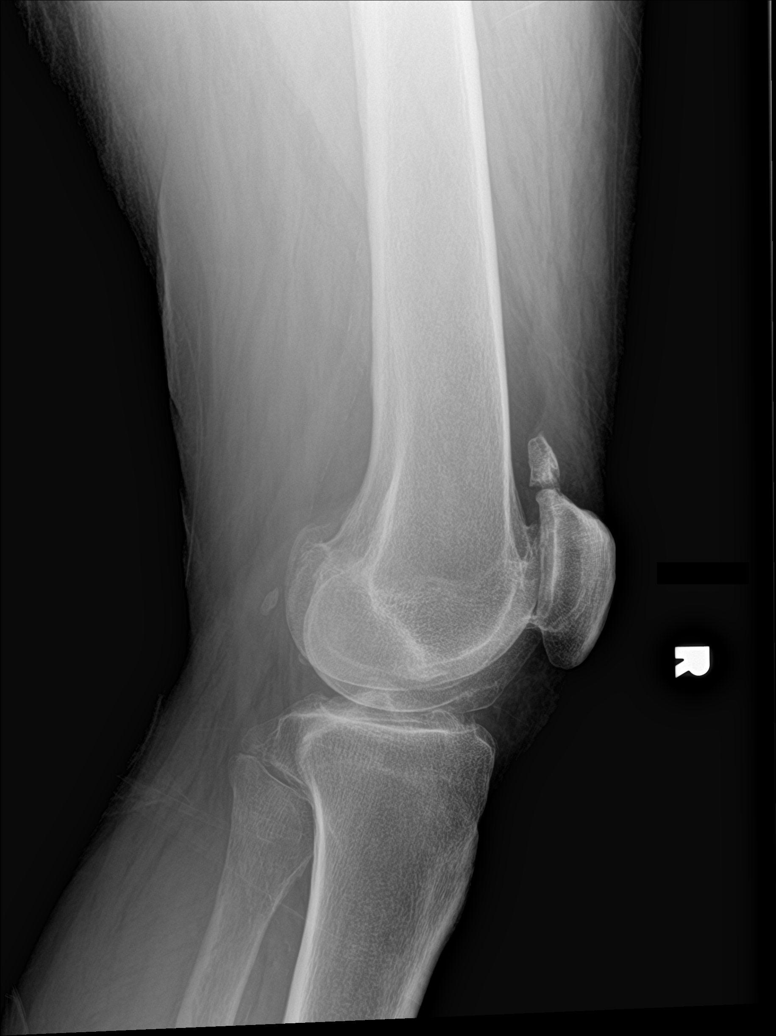

[femur lat (2 of 4)]
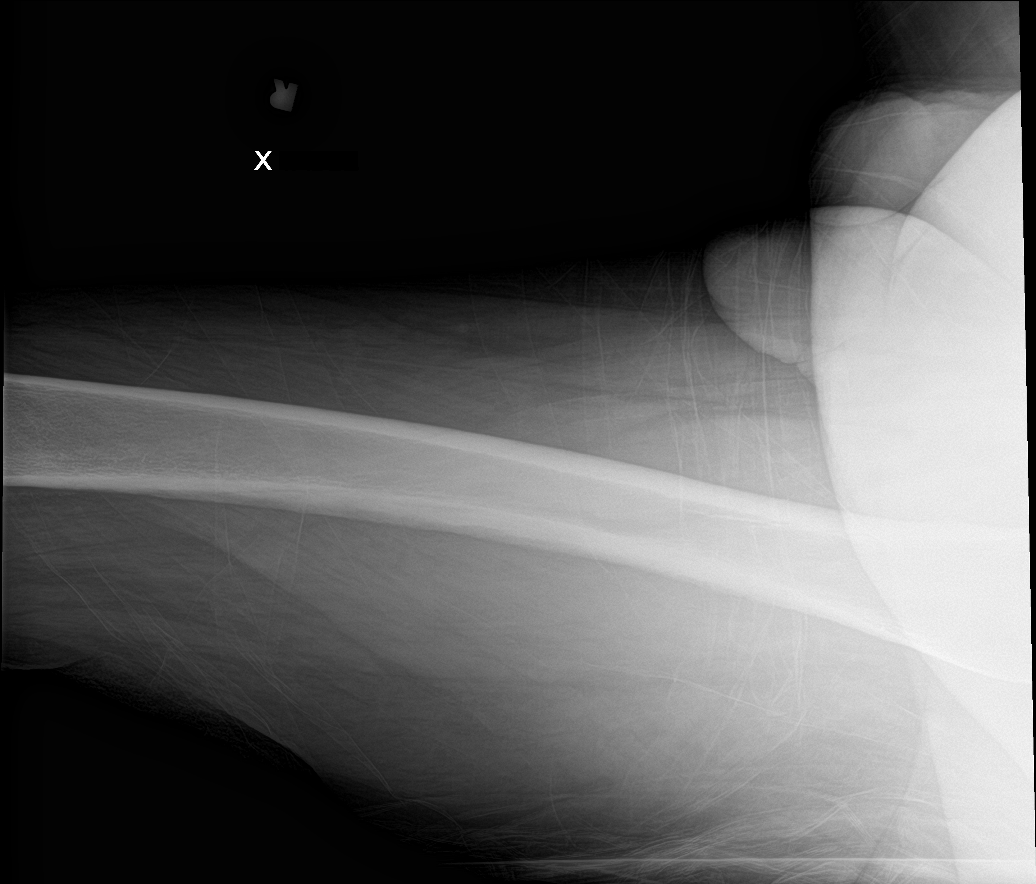

[femur ap (3 of 3)]
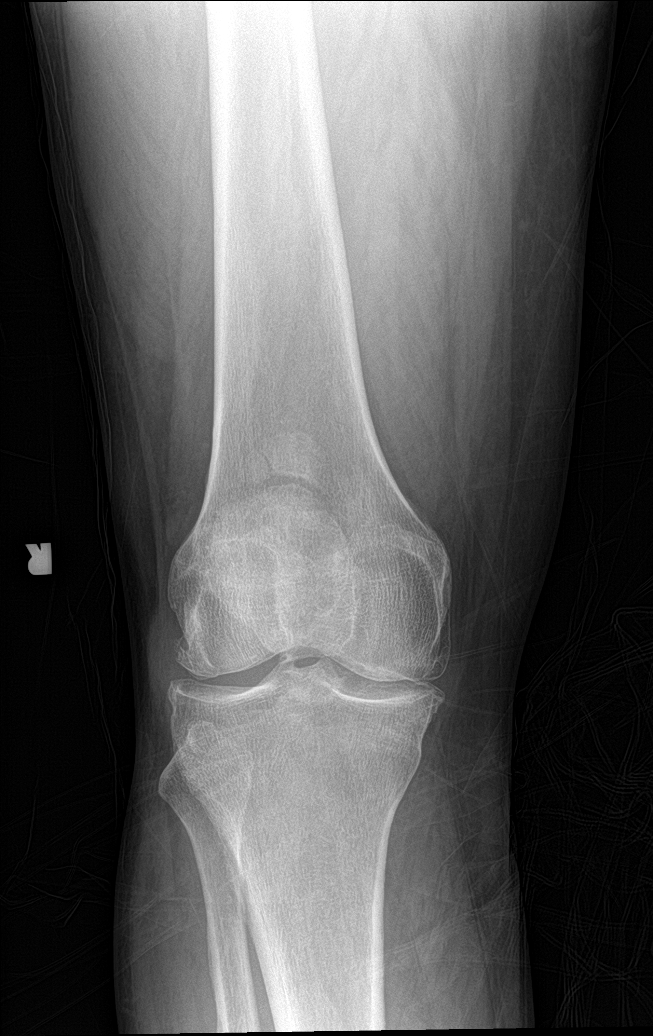

[femur lat (3 of 4)]
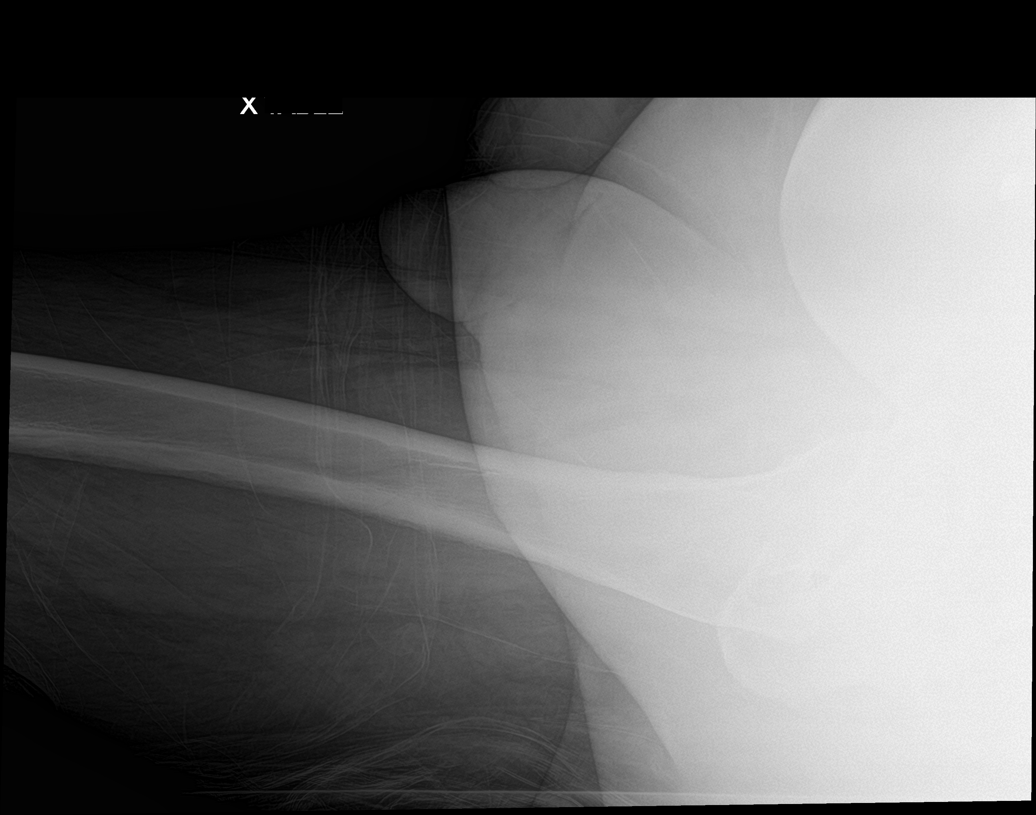

[femur lat (4 of 4)]
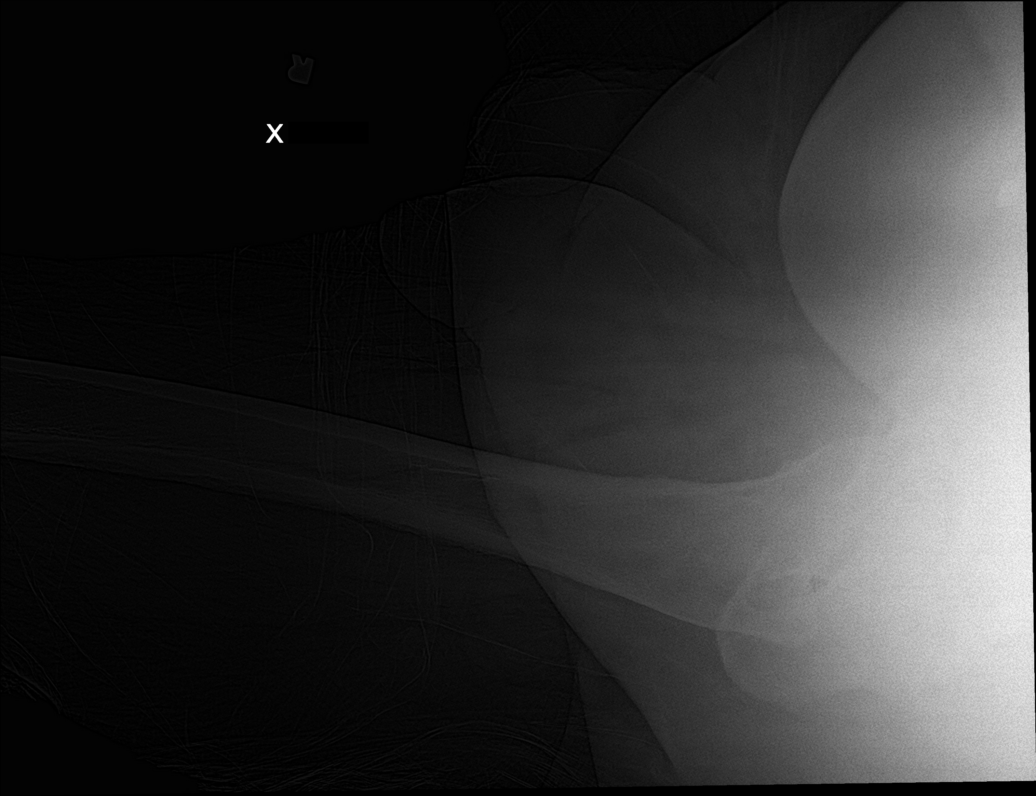

[7 of 7 positions shown; findings below may reference images not displayed]

FINDINGS: AP and lateral views of the RIGHT femur are provided. Osseous
alignment is normal. No fracture line or displaced fracture fragment
is seen. Soft tissues about the RIGHT femur are unremarkable.
Incidental note made of chronic calcification at the upper pole of
the patella, presumably chronic calcific tendinopathy of the
quadriceps tendon insertion site.
IMPRESSION: 1. No acute findings. No osseous fracture or dislocation.
2. Chronic calcific tendinopathy versus sequela of old injury at the
upper margin of the patella.

## 2024-06-23 ENCOUNTER — Inpatient Hospital Stay: Attending: Radiation Oncology | Admitting: General Practice

## 2024-06-23 NOTE — Progress Notes (Signed)
 [facility] Healthcare Advance Directives Spiritual Care  Patient presented to Advance Directives Clinic to review and complete healthcare advance directives.  Clinical Chaplain met with patient and his spouse.  The patient designated Imre Vecchione of [place] ([phone]) as their primary healthcare agent and Jamie Hundley Bullins [phone]) as their secondary agent.  Patient also completed healthcare living will.    Documents were notarized and copies made for patient/family. Chaplain will send documents to medical records to be scanned into patient's chart. Chaplain encouraged patient/family to contact with any additional questions or concerns.   [address] [place], [place], [facility] Pager [phone] Voicemail [phone]
